# Patient Record
Sex: Female | Born: 1965 | Race: Black or African American | Hispanic: No | Marital: Single | Smoking: Never smoker
Health system: Southern US, Community
[De-identification: ages and names within clinical notes are randomized; demographics above are authoritative.]

## PROBLEM LIST (undated history)

## (undated) DIAGNOSIS — E876 Hypokalemia: Secondary | ICD-10-CM

## (undated) DIAGNOSIS — M545 Low back pain, unspecified: Secondary | ICD-10-CM

## (undated) DIAGNOSIS — R0602 Shortness of breath: Secondary | ICD-10-CM

## (undated) DIAGNOSIS — I10 Essential (primary) hypertension: Secondary | ICD-10-CM

## (undated) DIAGNOSIS — Z789 Other specified health status: Secondary | ICD-10-CM

## (undated) DIAGNOSIS — E785 Hyperlipidemia, unspecified: Secondary | ICD-10-CM

## (undated) DIAGNOSIS — R0789 Other chest pain: Secondary | ICD-10-CM

## (undated) HISTORY — DX: Hyperlipidemia, unspecified: E78.5

## (undated) HISTORY — PX: LAPAROSCOPIC HYSTERECTOMY: SHX1926

## (undated) HISTORY — DX: Low back pain, unspecified: M54.50

## (undated) HISTORY — DX: Other chest pain: R07.89

## (undated) HISTORY — DX: Other specified health status: Z78.9

## (undated) HISTORY — DX: Hypokalemia: E87.6

## (undated) HISTORY — DX: Shortness of breath: R06.02

## (undated) HISTORY — PX: TONSILLECTOMY: SUR1361

---

## 2000-12-04 ENCOUNTER — Encounter: Admission: RE | Admit: 2000-12-04 | Discharge: 2000-12-04 | Payer: Self-pay | Admitting: *Deleted

## 2000-12-04 ENCOUNTER — Encounter: Payer: Self-pay | Admitting: *Deleted

## 2002-06-08 ENCOUNTER — Other Ambulatory Visit: Admission: RE | Admit: 2002-06-08 | Discharge: 2002-06-08 | Payer: Self-pay | Admitting: Gynecology

## 2003-10-21 ENCOUNTER — Emergency Department (HOSPITAL_COMMUNITY): Admission: AD | Admit: 2003-10-21 | Discharge: 2003-10-21 | Payer: Self-pay | Admitting: Family Medicine

## 2003-10-25 ENCOUNTER — Other Ambulatory Visit: Admission: RE | Admit: 2003-10-25 | Discharge: 2003-10-25 | Payer: Self-pay | Admitting: Gynecology

## 2010-08-07 ENCOUNTER — Ambulatory Visit (HOSPITAL_COMMUNITY): Admission: RE | Admit: 2010-08-07 | Discharge: 2010-08-07 | Payer: Self-pay | Admitting: Family Medicine

## 2010-09-05 ENCOUNTER — Inpatient Hospital Stay (HOSPITAL_COMMUNITY)
Admission: EM | Admit: 2010-09-05 | Discharge: 2010-09-07 | Payer: Self-pay | Source: Home / Self Care | Admitting: Emergency Medicine

## 2010-09-06 DIAGNOSIS — D259 Leiomyoma of uterus, unspecified: Secondary | ICD-10-CM

## 2010-09-07 ENCOUNTER — Encounter (INDEPENDENT_AMBULATORY_CARE_PROVIDER_SITE_OTHER): Payer: Self-pay | Admitting: Nurse Practitioner

## 2010-09-08 ENCOUNTER — Ambulatory Visit: Payer: Self-pay | Admitting: Nurse Practitioner

## 2010-09-08 DIAGNOSIS — E876 Hypokalemia: Secondary | ICD-10-CM

## 2010-09-11 ENCOUNTER — Encounter (INDEPENDENT_AMBULATORY_CARE_PROVIDER_SITE_OTHER): Payer: Self-pay | Admitting: Nurse Practitioner

## 2010-11-06 ENCOUNTER — Emergency Department (HOSPITAL_COMMUNITY)
Admission: EM | Admit: 2010-11-06 | Discharge: 2010-11-06 | Payer: Self-pay | Source: Home / Self Care | Admitting: Emergency Medicine

## 2010-12-19 NOTE — Letter (Signed)
Summary: PHYSICIAN'S ORDER  PHYSICIAN'S ORDER   Imported By: Arta Bruce 09/11/2010 16:41:28  _____________________________________________________________________  External Attachment:    Type:   Image     Comment:   External Document

## 2010-12-19 NOTE — Letter (Signed)
Summary: PT INFORMATIOJN SHEET  PT INFORMATIOJN SHEET   Imported By: Arta Bruce 09/11/2010 12:13:54  _____________________________________________________________________  External Attachment:    Type:   Image     Comment:   External Document

## 2010-12-19 NOTE — Miscellaneous (Signed)
Summary: Hospital Update  Clinical Lists Changes  Notes entered as per hospital f/u  Medications: Added new medication of DOCUSATE SODIUM 100 MG CAPS (DOCUSATE SODIUM) One capsule by mouth two times a day Added new medication of FERROUS SULFATE 325 (65 FE) MG TBEC (FERROUS SULFATE) One tablet by mouth three times a day Added new medication of POTASSIUM CHLORIDE CRYS CR 20 MEQ CR-TABS (POTASSIUM CHLORIDE CRYS CR) One tablet by mouth daily Added new medication of FEXOFENADINE HCL 180 MG TABS (FEXOFENADINE HCL) One table by mouth daily for allergies Added new medication of VITAMIN C 100 MG CHEW (ASCORBIC ACID) One tablet by mouth daily Added new medication of VITAMIN E 400 UNIT CAPS (VITAMIN E) One capsule by mouth daily Allergies: Added new allergy or adverse reaction of SULFA Added new allergy or adverse reaction of PENICILLIN Added new allergy or adverse reaction of * NICKEL Observations: Added new observation of NKA: F (09/11/2010 10:20)  Appended Document: Hospital Update    Clinical Lists Changes  Problems: Added new problem of FIBROIDS, UTERUS (ICD-218.9) Observations: Added new observation of PELVIS US: enlarged uterus at least three focal fibroids.  The largest fibroid is in the posterior uterine body and has a submucosal component that displaces the endometrium anteriorly.  Normal ovaries (09/06/2010 10:44) Added new observation of PLATELETK/UL: 796 K/uL (09/06/2010 10:43) Added new observation of RDW: 32.9 % (09/06/2010 10:43) Added new observation of MCHC RBC: 32.5 g/dL (04/54/0981 19:14) Added new observation of MCV: 68.5 fL (09/06/2010 10:43) Added new observation of HCT: 29.2 % (09/06/2010 10:43) Added new observation of HGB: 9.5 g/dL (78/29/5621 30:86) Added new observation of RBC M/UL: 4.26 M/uL (09/06/2010 10:43) Added new observation of WBC COUNT: 8.3 10*3/microliter (09/06/2010 10:43) Added new observation of CALCIUM: 8.4 mg/dL (57/84/6962 95:28) Added new  observation of CREATININE: 0.69 mg/dL (41/32/4401 02:72) Added new observation of BUN: 8 mg/dL (53/66/4403 47:42) Added new observation of BG RANDOM: 85 mg/dL (59/56/3875 64:33) Added new observation of CO2 PLSM/SER: 26 meq/L (09/06/2010 10:43) Added new observation of CL SERUM: 108 meq/L (09/06/2010 10:43) Added new observation of K SERUM: 2.7 meq/L (09/06/2010 10:43) Added new observation of NA: 140 meq/L (09/06/2010 10:43) Added new observation of CXR RESULTS: cardiomegaly. No active disease  (09/05/2010 10:46) Added new observation of OTHER X-RAY: shoulder - no bony abnormality  (09/05/2010 10:45)       Pelvic US  Procedure date:  09/06/2010  Findings:      enlarged uterus at least three focal fibroids.  The largest fibroid is in the posterior uterine body and has a submucosal component that displaces the endometrium anteriorly.  Normal ovaries  X-ray  Procedure date:  09/05/2010  Findings:      shoulder - no bony abnormality  CXR  Procedure date:  09/05/2010  Findings:      cardiomegaly. No active disease   Pelvic US  Procedure date:  09/06/2010  Findings:      enlarged uterus at least three focal fibroids.  The largest fibroid is in the posterior uterine body and has a submucosal component that displaces the endometrium anteriorly.  Normal ovaries  X-ray  Procedure date:  09/05/2010  Findings:      shoulder - no bony abnormality  CXR  Procedure date:  09/05/2010  Findings:      cardiomegaly. No active disease

## 2010-12-19 NOTE — Letter (Signed)
Summary: *HSN Results Follow up  [facility]  [address] [place], [place] [address]   Phone: [phone]  Fax: [phone]      09/11/2010   [facility] [address] [place], [place]  [address]   Dear  [name],                            ____S.Drinkard,FNP   ____D. Gore,FNP       ____B. McPherson,MD   ____V. Rankins,MD    ____E. Mulberry,MD    __X__N. Daphine Deutscher, FNP  ____D. Reche Dixon, MD    ____K. Philipp Deputy, MD    ____Other     This letter is to inform you that your recent test(s):  _______Pap Smear    ___X____Lab Test     _______X-ray    ___X____ is within acceptable limits  _______ requires a medication change  _______ requires a follow-up lab visit  _______ requires a follow-up visit with your [name]   Comments: Labs done during recent office visit are normal.  Remember to keep your appointment with the [name] on __________________________________.       _________________________________________________________ If you have any questions, please contact our office [phone].                    Sincerely,    Lehman Prom FNP [facility]

## 2011-01-29 LAB — POCT I-STAT, CHEM 8
Calcium, Ion: 1.08 mmol/L — ABNORMAL LOW (ref 1.12–1.32)
Chloride: 101 mEq/L (ref 96–112)
Creatinine, Ser: 0.8 mg/dL (ref 0.4–1.2)
Glucose, Bld: 86 mg/dL (ref 70–99)
Potassium: 3 mEq/L — ABNORMAL LOW (ref 3.5–5.1)

## 2011-01-31 LAB — CBC
HCT: 29.2 % — ABNORMAL LOW (ref 36.0–46.0)
HCT: 30.2 % — ABNORMAL LOW (ref 36.0–46.0)
Hemoglobin: 9.7 g/dL — ABNORMAL LOW (ref 12.0–15.0)
MCH: 16.9 pg — ABNORMAL LOW (ref 26.0–34.0)
MCH: 22.3 pg — ABNORMAL LOW (ref 26.0–34.0)
MCHC: 32.2 g/dL (ref 30.0–36.0)
MCV: 55.9 fL — ABNORMAL LOW (ref 78.0–100.0)
MCV: 68.5 fL — ABNORMAL LOW (ref 78.0–100.0)
MCV: 69.2 fL — ABNORMAL LOW (ref 78.0–100.0)
Platelets: 796 10*3/uL — ABNORMAL HIGH (ref 150–400)
Platelets: 955 10*3/uL (ref 150–400)
Platelets: 956 10*3/uL (ref 150–400)
RBC: 2.86 MIL/uL — ABNORMAL LOW (ref 3.87–5.11)
RBC: 4.26 MIL/uL (ref 3.87–5.11)
RDW: 24.4 % — ABNORMAL HIGH (ref 11.5–15.5)
WBC: 4.7 10*3/uL (ref 4.0–10.5)
WBC: 5.5 10*3/uL (ref 4.0–10.5)
WBC: 8.3 10*3/uL (ref 4.0–10.5)

## 2011-01-31 LAB — BASIC METABOLIC PANEL
BUN: 5 mg/dL — ABNORMAL LOW (ref 6–23)
CO2: 24 mEq/L (ref 19–32)
CO2: 31 mEq/L (ref 19–32)
Chloride: 106 mEq/L (ref 96–112)
Chloride: 108 mEq/L (ref 96–112)
Creatinine, Ser: 0.69 mg/dL (ref 0.4–1.2)
GFR calc Af Amer: >60 mL/min (ref >60)
Glucose, Bld: 88 mg/dL (ref 70–99)
Glucose, Bld: 97 mg/dL (ref 70–99)
Potassium: 2.5 mEq/L — CL (ref 3.5–5.1)
Potassium: 2.7 mEq/L — CL (ref 3.5–5.1)
Potassium: 3 mEq/L — ABNORMAL LOW (ref 3.5–5.1)
Sodium: 141 mEq/L (ref 135–145)

## 2011-01-31 LAB — DIFFERENTIAL
Band Neutrophils: 0 % (ref 0–10)
Basophils Relative: 0 % (ref 0–1)
Blasts: 0 %
Eosinophils Absolute: 0 10*3/uL (ref 0.0–0.7)
Eosinophils Absolute: 0.1 10*3/uL (ref 0.0–0.7)
Eosinophils Relative: 0 % (ref 0–5)
Metamyelocytes Relative: 0 %
Monocytes Absolute: 0.1 10*3/uL (ref 0.1–1.0)
Monocytes Absolute: 0.2 10*3/uL (ref 0.1–1.0)
Monocytes Relative: 2 % — ABNORMAL LOW (ref 3–12)
Myelocytes: 0 %
Neutro Abs: 2.8 10*3/uL (ref 1.7–7.7)
Neutrophils Relative %: 52 % (ref 43–77)
nRBC: 0 /100 WBC

## 2011-01-31 LAB — URINALYSIS, ROUTINE W REFLEX MICROSCOPIC
Bilirubin Urine: NEGATIVE
Glucose, UA: NEGATIVE mg/dL
Ketones, ur: NEGATIVE mg/dL
Specific Gravity, Urine: 1.01 (ref 1.005–1.030)
pH: 7.5 (ref 5.0–8.0)

## 2011-01-31 LAB — CROSSMATCH
ABO/RH(D): B POS
Antibody Screen: NEGATIVE
Unit division: 0
Unit division: 0

## 2011-01-31 LAB — RETICULOCYTES
RBC.: 3.04 MIL/uL — ABNORMAL LOW (ref 3.87–5.11)
Retic Ct Pct: 1 % (ref 0.4–3.1)

## 2011-01-31 LAB — URINE CULTURE
Colony Count: 40000
Culture  Setup Time: 201110190109

## 2011-01-31 LAB — IRON AND TIBC: UIBC: >450 ug/dL

## 2011-01-31 LAB — VITAMIN B12: Vitamin B-12: 533 pg/mL (ref 211–911)

## 2011-01-31 LAB — PATHOLOGIST SMEAR REVIEW

## 2011-01-31 LAB — URINE MICROSCOPIC-ADD ON

## 2011-01-31 LAB — POCT PREGNANCY, URINE: Preg Test, Ur: NEGATIVE

## 2011-01-31 LAB — CHLORIDE, URINE, RANDOM: Chloride Urine: 79 mEq/L

## 2011-01-31 LAB — ABO/RH: ABO/RH(D): B POS

## 2011-08-01 ENCOUNTER — Other Ambulatory Visit (HOSPITAL_COMMUNITY): Payer: Self-pay | Admitting: Family Medicine

## 2011-08-31 ENCOUNTER — Other Ambulatory Visit (HOSPITAL_COMMUNITY): Payer: Self-pay | Admitting: Family Medicine

## 2011-08-31 DIAGNOSIS — Z1231 Encounter for screening mammogram for malignant neoplasm of breast: Secondary | ICD-10-CM

## 2011-09-17 ENCOUNTER — Ambulatory Visit (HOSPITAL_COMMUNITY)
Admission: RE | Admit: 2011-09-17 | Discharge: 2011-09-17 | Disposition: A | Discharge status: Home / Self Care | Payer: Self-pay | Source: Ambulatory Visit | Attending: Family Medicine | Admitting: Family Medicine

## 2011-09-17 DIAGNOSIS — Z1231 Encounter for screening mammogram for malignant neoplasm of breast: Secondary | ICD-10-CM

## 2013-01-19 ENCOUNTER — Other Ambulatory Visit (HOSPITAL_COMMUNITY): Payer: Self-pay | Admitting: Family Medicine

## 2013-01-19 DIAGNOSIS — Z1231 Encounter for screening mammogram for malignant neoplasm of breast: Secondary | ICD-10-CM

## 2013-01-28 ENCOUNTER — Ambulatory Visit (HOSPITAL_COMMUNITY)
Admission: RE | Admit: 2013-01-28 | Discharge: 2013-01-28 | Disposition: A | Discharge status: Home / Self Care | Payer: Self-pay | Source: Ambulatory Visit | Attending: Family Medicine | Admitting: Family Medicine

## 2014-04-15 ENCOUNTER — Encounter (HOSPITAL_COMMUNITY): Payer: Self-pay | Admitting: Emergency Medicine

## 2014-04-15 ENCOUNTER — Emergency Department (HOSPITAL_COMMUNITY)
Admission: EM | Admit: 2014-04-15 | Discharge: 2014-04-15 | Disposition: A | Discharge status: Home / Self Care | Payer: No Typology Code available for payment source | Attending: Emergency Medicine | Admitting: Emergency Medicine

## 2014-04-15 DIAGNOSIS — R202 Paresthesia of skin: Secondary | ICD-10-CM

## 2014-04-15 DIAGNOSIS — Z880 Allergy status to penicillin: Secondary | ICD-10-CM | POA: Insufficient documentation

## 2014-04-15 DIAGNOSIS — I10 Essential (primary) hypertension: Secondary | ICD-10-CM

## 2014-04-15 DIAGNOSIS — R209 Unspecified disturbances of skin sensation: Secondary | ICD-10-CM | POA: Insufficient documentation

## 2014-04-15 DIAGNOSIS — R51 Headache: Secondary | ICD-10-CM | POA: Insufficient documentation

## 2014-04-15 DIAGNOSIS — E876 Hypokalemia: Secondary | ICD-10-CM

## 2014-04-15 DIAGNOSIS — Z91040 Latex allergy status: Secondary | ICD-10-CM | POA: Insufficient documentation

## 2014-04-15 HISTORY — DX: Essential (primary) hypertension: I10

## 2014-04-15 LAB — CBC WITH DIFFERENTIAL/PLATELET
Basophils Absolute: 0 10*3/uL (ref 0.0–0.1)
Basophils Relative: 0 % (ref 0–1)
Eosinophils Absolute: 0.1 10*3/uL (ref 0.0–0.7)
Eosinophils Relative: 3 % (ref 0–5)
HCT: 35.4 % — ABNORMAL LOW (ref 36.0–46.0)
HEMOGLOBIN: 12.3 g/dL (ref 12.0–15.0)
LYMPHS ABS: 2.2 10*3/uL (ref 0.7–4.0)
Lymphocytes Relative: 41 % (ref 12–46)
MCH: 28.5 pg (ref 26.0–34.0)
MCHC: 34.7 g/dL (ref 30.0–36.0)
MCV: 82.1 fL (ref 78.0–100.0)
MONOS PCT: 7 % (ref 3–12)
Monocytes Absolute: 0.4 10*3/uL (ref 0.1–1.0)
NEUTROS ABS: 2.7 10*3/uL (ref 1.7–7.7)
NEUTROS PCT: 49 % (ref 43–77)
Platelets: 342 10*3/uL (ref 150–400)
RBC: 4.31 MIL/uL (ref 3.87–5.11)
RDW: 13.4 % (ref 11.5–15.5)
WBC: 5.4 10*3/uL (ref 4.0–10.5)

## 2014-04-15 LAB — COMPREHENSIVE METABOLIC PANEL
ALBUMIN: 3.9 g/dL (ref 3.5–5.2)
ALK PHOS: 91 U/L (ref 39–117)
ALT: 14 U/L (ref 0–35)
AST: 20 U/L (ref 0–37)
BILIRUBIN TOTAL: 0.9 mg/dL (ref 0.3–1.2)
BUN: 11 mg/dL (ref 6–23)
CHLORIDE: 98 meq/L (ref 96–112)
CO2: 33 mEq/L — ABNORMAL HIGH (ref 19–32)
Calcium: 8.7 mg/dL (ref 8.4–10.5)
Creatinine, Ser: 0.75 mg/dL (ref 0.50–1.10)
GFR calc Af Amer: >90 mL/min (ref >90)
GFR calc non Af Amer: >90 mL/min (ref >90)
GLUCOSE: 88 mg/dL (ref 70–99)
POTASSIUM: 2.3 meq/L — AB (ref 3.7–5.3)
Sodium: 143 mEq/L (ref 137–147)
Total Protein: 7.3 g/dL (ref 6.0–8.3)

## 2014-04-15 LAB — I-STAT TROPONIN, ED: TROPONIN I, POC: 0.01 ng/mL (ref 0.00–0.08)

## 2014-04-15 MED ORDER — LISINOPRIL 20 MG PO TABS: 20.0000 mg | ORAL_TABLET | Freq: Every day | ORAL | Status: DC

## 2014-04-15 MED ORDER — POTASSIUM CHLORIDE ER 20 MEQ PO TBCR: 40.0000 meq | EXTENDED_RELEASE_TABLET | Freq: Every day | ORAL | Status: AC

## 2014-04-15 MED ORDER — POTASSIUM CHLORIDE CRYS ER 20 MEQ PO TBCR
40.0000 meq | EXTENDED_RELEASE_TABLET | Freq: Once | ORAL | Status: AC
  Administered 2014-04-15: 40 meq via ORAL
  Filled 2014-04-15: qty 2

## 2014-04-15 MED ORDER — KETOROLAC TROMETHAMINE 15 MG/ML IJ SOLN
15.0000 mg | Freq: Once | INTRAMUSCULAR | Status: AC
  Administered 2014-04-15: 15 mg via INTRAMUSCULAR
  Filled 2014-04-15: qty 1

## 2014-04-15 MED ORDER — KETOROLAC TROMETHAMINE 15 MG/ML IJ SOLN
15.0000 mg | Freq: Once | INTRAMUSCULAR | Status: DC
Start: 2014-04-15 — End: 2014-04-15

## 2014-04-15 NOTE — ED Notes (Signed)
Pt sts that at 1600 she noticed she had "tingling" in her L arm, face and R arm. Pt noted to have full strength in bilateral arms and legs. No sx of stroke. Negative on stroke scale. Clear speech with facial symmetry. Pt denies any CP or SOB.

## 2014-04-15 NOTE — Discharge Instructions (Signed)
Call for a follow up appointment with a Family or Primary Care Provider for further relation of your hypertension and hypo-kalemia (low blood potassium). Return if symptoms worsen.   Stopped taking your current lisinopril/HCTZ medication and only take lisinopril until you're evaluated by her primary care provider.

## 2014-04-15 NOTE — ED Provider Notes (Signed)
CSN: [id number]     Arrival date & time 04/15/14  1646 History   First MD Initiated Contact with Patient 04/15/14 1955     Chief Complaint  Patient presents with  . Tingling     (Consider location/radiation/quality/duration/timing/severity/associated sxs/prior Treatment) HPI Comments: The patient is a [age]-year-old female with past medical history of hypertension presenting the emergency apartment with chief complaint of bilateral upper extremity tingling and facial tingling. The patient reports abrupt onset of symptoms at 1600.  The patient states she went to her house where her tinnitus destroyed her house and left inferior poor condition.  She reports symptoms lasted approximately one hour. She reports associated left sided headache. She reports she was unable to let go of the we'll do to her left hand "would not let go". The patient reports she has a history of hypertension was recently restarted on her lisinopril by her PCP. She reports an increase in stress over the last several days due to a friend's death. Denies shortness of breath, chest pain, fever, coordination problems. Complete hysterectomy and 2014. PCP: [place]  The history is provided by the patient. No language interpreter was used.    Past Medical History  Diagnosis Date  . Hypertension    No past surgical history on file. History reviewed. No pertinent family history. History  Substance Use Topics  . Smoking status: Never Smoker   . Smokeless tobacco: Never Used  . Alcohol Use: No   OB History   Grav Para Term Preterm Abortions TAB SAB Ect Mult Living                 Review of Systems  Constitutional: Negative for fever and chills.  Eyes: Negative for photophobia and visual disturbance.  Respiratory: Negative for shortness of breath.   Cardiovascular: Negative for chest pain.  Gastrointestinal: Negative for nausea, vomiting and abdominal pain.  Genitourinary: Negative for dysuria and vaginal bleeding.   Musculoskeletal: Positive for myalgias.  Neurological: Positive for numbness and headaches. Negative for dizziness, tremors, syncope, facial asymmetry and light-headedness.  Psychiatric/Behavioral: Negative for confusion.      Allergies  Chocolate; Latex; Penicillins; Shellfish allergy; and Sulfonamide derivatives  Home Medications   Prior to Admission medications   Not on File   BP 218/99  Pulse 88  Temp(Src) 98.9 F (37.2 C) (Oral)  Resp 20  SpO2 100% Physical Exam  Nursing note and vitals reviewed. Constitutional: She is oriented to person, place, and time. She appears well-developed and well-nourished.  Non-toxic appearance. She does not have a sickly appearance. She does not appear ill. No distress.  HENT:  Head: Normocephalic and atraumatic.  Right Ear: Tympanic membrane and external ear normal.  Left Ear: Tympanic membrane and external ear normal.  Left temple nontender to palpation  Eyes: EOM are normal. Pupils are equal, round, and reactive to light. No scleral icterus.  Neck: Neck supple.  Cardiovascular: Normal rate, regular rhythm and normal heart sounds.   No murmur heard. Pulmonary/Chest: Effort normal and breath sounds normal. No respiratory distress. She has no wheezes. She has no rales.  Abdominal: Soft. Bowel sounds are normal. There is no tenderness. There is no rebound and no guarding.  Musculoskeletal: Normal range of motion. She exhibits no edema.  Neurological: She is alert and oriented to person, place, and time.  Speech is clear and goal oriented, follows commands Cranial nerves III - XII grossly intact, no facial droop Normal strength in upper and lower extremities bilaterally, strong and equal grip  strength Sensation normal to light touch Moves all 4 extremities without ataxia, coordination intact Normal finger to nose and rapid alternating movements No pronator drift  Skin: Skin is warm and dry. No rash noted. She is not diaphoretic.   Psychiatric: Her speech is normal and behavior is normal. Her mood appears anxious.  Mildly anxious    ED Course  Procedures (including critical care time) Labs Review Results for orders placed during the hospital encounter of 04/15/14  CBC WITH DIFFERENTIAL      Result Value Ref Range   WBC 5.4  4.0 - 10.5 K/uL   RBC 4.31  3.87 - 5.11 MIL/uL   Hemoglobin 12.3  12.0 - 15.0 g/dL   HCT 35.4 (*) 36.0 - 46.0 %   MCV 82.1  78.0 - 100.0 fL   MCH 28.5  26.0 - 34.0 pg   MCHC 34.7  30.0 - 36.0 g/dL   RDW 13.4  11.5 - 15.5 %   Platelets 342  150 - 400 K/uL   Neutrophils Relative % 49  43 - 77 %   Neutro Abs 2.7  1.7 - 7.7 K/uL   Lymphocytes Relative 41  12 - 46 %   Lymphs Abs 2.2  0.7 - 4.0 K/uL   Monocytes Relative 7  3 - 12 %   Monocytes Absolute 0.4  0.1 - 1.0 K/uL   Eosinophils Relative 3  0 - 5 %   Eosinophils Absolute 0.1  0.0 - 0.7 K/uL   Basophils Relative 0  0 - 1 %   Basophils Absolute 0.0  0.0 - 0.1 K/uL  COMPREHENSIVE METABOLIC PANEL      Result Value Ref Range   Sodium 143  137 - 147 mEq/L   Potassium 2.3 (*) 3.7 - 5.3 mEq/L   Chloride 98  96 - 112 mEq/L   CO2 33 (*) 19 - 32 mEq/L   Glucose, Bld 88  70 - 99 mg/dL   BUN 11  6 - 23 mg/dL   Creatinine, Ser 0.75  0.50 - 1.10 mg/dL   Calcium 8.7  8.4 - 10.5 mg/dL   Total Protein 7.3  6.0 - 8.3 g/dL   Albumin 3.9  3.5 - 5.2 g/dL   AST 20  0 - 37 U/L   ALT 14  0 - 35 U/L   Alkaline Phosphatase 91  39 - 117 U/L   Total Bilirubin 0.9  0.3 - 1.2 mg/dL   GFR calc non Af Amer >90  >90 mL/min   GFR calc Af Amer >90  >90 mL/min  I-STAT TROPOININ, ED      Result Value Ref Range   Troponin i, poc 0.01  0.00 - 0.08 ng/mL   Comment 3              EKG Interpretation   Date/Time:  Thursday Apr 15 2014 16:54:22 EDT Ventricular Rate:  77 PR Interval:  131 QRS Duration: 94 QT Interval:  455 QTC Calculation: 515 R Axis:   43 Text Interpretation:  Sinus rhythm Prolonged QT interval Baseline wander  in lead(s) V1 V3 Slow R  progression Confirmed by Jeneen Rinks  MD, [place] ([id number])  on 04/15/2014 8:15:55 PM      MDM   Final diagnoses:  Hypokalemia  Hypertension  Hand tingling   Patient presents with a history of one hour bilateral hand and facial tingling after an emotional upset, likely due to anxiety. Currently asymptomatic in the ED. Her blood pressure on arrival is 218/99, reports  compliance with lisinopril/HCTZ. EKG, CBC, CMP troponin ordered. Potassium 2.3. Previous history of hypokalemia in the past patient relates it to menorrhagia. Reeval patient resting comfortably in room reports resolution of headache after medication. BP 162/80.  Discussed lab results, imaging results, and treatment plan with the patient.  Advised patient to stop HCTZ replace potassium orally. Followup with PCP for repeat potassium level, currently taking Mg supplement.  Return precautions given. Reports understanding and no other concerns at this time.  Patient is stable for discharge at this time.  Meds given in ED:  Medications  ketorolac (TORADOL) 15 MG/ML injection 15 mg (15 mg Intramuscular Given 04/15/14 2027)  potassium chloride SA (K-DUR,KLOR-CON) CR tablet 40 mEq (40 mEq Oral Given 04/15/14 2129)    Discharge Medication List as of 04/15/2014 10:16 PM    START taking these medications   Details  lisinopril (PRINIVIL,ZESTRIL) 20 MG tablet Take 1 tablet (20 mg total) by mouth daily., Starting 04/15/2014, Until Discontinued, Print    potassium chloride 20 MEQ TBCR Take 40 mEq by mouth daily., Starting 04/15/2014, Last dose on Tue 04/20/14, Print           Lorrine Kin, PA-C 04/16/14 [phone]

## 2014-04-15 NOTE — ED Notes (Signed)
DC home with friend.

## 2014-04-19 NOTE — ED Provider Notes (Signed)
Medical screening examination/treatment/procedure(s) were performed by non-physician practitioner and as supervising physician I was immediately available for consultation/collaboration.   EKG Interpretation   Date/Time:  Thursday Apr 15 2014 16:54:22 EDT Ventricular Rate:  77 PR Interval:  131 QRS Duration: 94 QT Interval:  455 QTC Calculation: 515 R Axis:   43 Text Interpretation:  Sinus rhythm Prolonged QT interval Baseline wander  in lead(s) V1 V3 Slow R progression Confirmed by Jeneen Rinks  MD, [place] ([id number])  on 04/15/2014 8:15:55 PM        Tanna Furry, MD 04/19/14 2347

## 2014-09-06 ENCOUNTER — Other Ambulatory Visit (HOSPITAL_COMMUNITY): Payer: Self-pay | Admitting: Specialist

## 2014-09-06 DIAGNOSIS — Z01419 Encounter for gynecological examination (general) (routine) without abnormal findings: Secondary | ICD-10-CM

## 2015-05-13 ENCOUNTER — Emergency Department (HOSPITAL_COMMUNITY)
Admission: EM | Admit: 2015-05-13 | Discharge: 2015-05-13 | Disposition: A | Discharge status: Home / Self Care | Payer: 59 | Attending: Emergency Medicine | Admitting: Emergency Medicine

## 2015-05-13 ENCOUNTER — Encounter (HOSPITAL_COMMUNITY): Payer: Self-pay

## 2015-05-13 ENCOUNTER — Emergency Department (HOSPITAL_COMMUNITY): Payer: 59

## 2015-05-13 DIAGNOSIS — Z79899 Other long term (current) drug therapy: Secondary | ICD-10-CM | POA: Insufficient documentation

## 2015-05-13 DIAGNOSIS — S60351A Superficial foreign body of right thumb, initial encounter: Secondary | ICD-10-CM | POA: Insufficient documentation

## 2015-05-13 DIAGNOSIS — I10 Essential (primary) hypertension: Secondary | ICD-10-CM | POA: Insufficient documentation

## 2015-05-13 DIAGNOSIS — S6991XA Unspecified injury of right wrist, hand and finger(s), initial encounter: Secondary | ICD-10-CM | POA: Diagnosis present

## 2015-05-13 DIAGNOSIS — Y998 Other external cause status: Secondary | ICD-10-CM | POA: Insufficient documentation

## 2015-05-13 DIAGNOSIS — Z91040 Latex allergy status: Secondary | ICD-10-CM | POA: Insufficient documentation

## 2015-05-13 DIAGNOSIS — W458XXA Other foreign body or object entering through skin, initial encounter: Secondary | ICD-10-CM | POA: Diagnosis not present

## 2015-05-13 DIAGNOSIS — Z23 Encounter for immunization: Secondary | ICD-10-CM | POA: Diagnosis not present

## 2015-05-13 DIAGNOSIS — T43211A Poisoning by selective serotonin and norepinephrine reuptake inhibitors, accidental (unintentional), initial encounter: Secondary | ICD-10-CM | POA: Diagnosis not present

## 2015-05-13 DIAGNOSIS — T445X1A Poisoning by predominantly beta-adrenoreceptor agonists, accidental (unintentional), initial encounter: Secondary | ICD-10-CM

## 2015-05-13 DIAGNOSIS — Y929 Unspecified place or not applicable: Secondary | ICD-10-CM | POA: Insufficient documentation

## 2015-05-13 DIAGNOSIS — Y9389 Activity, other specified: Secondary | ICD-10-CM | POA: Diagnosis not present

## 2015-05-13 DIAGNOSIS — Z880 Allergy status to penicillin: Secondary | ICD-10-CM | POA: Insufficient documentation

## 2015-05-13 MED ORDER — MORPHINE SULFATE 4 MG/ML IJ SOLN
4.0000 mg | Freq: Once | INTRAMUSCULAR | Status: AC
  Administered 2015-05-13: 4 mg via INTRAVENOUS
  Filled 2015-05-13: qty 1

## 2015-05-13 MED ORDER — HYDROMORPHONE HCL 1 MG/ML IJ SOLN
1.0000 mg | Freq: Once | INTRAMUSCULAR | Status: AC
  Administered 2015-05-13: 1 mg via INTRAVENOUS
  Filled 2015-05-13: qty 1

## 2015-05-13 MED ORDER — OXYCODONE-ACETAMINOPHEN 5-325 MG PO TABS: 1.0000 | ORAL_TABLET | Freq: Four times a day (QID) | ORAL | Status: AC | PRN

## 2015-05-13 MED ORDER — LIDOCAINE HCL (PF) 1 % IJ SOLN
5.0000 mL | Freq: Once | INTRAMUSCULAR | Status: AC
  Administered 2015-05-13: 5 mL
  Filled 2015-05-13: qty 5

## 2015-05-13 MED ORDER — ONDANSETRON HCL 4 MG/2ML IJ SOLN
4.0000 mg | Freq: Once | INTRAMUSCULAR | Status: AC
  Administered 2015-05-13: 4 mg via INTRAVENOUS
  Filled 2015-05-13: qty 2

## 2015-05-13 MED ORDER — CLINDAMYCIN HCL 150 MG PO CAPS: 300.0000 mg | ORAL_CAPSULE | Freq: Four times a day (QID) | ORAL | Status: DC

## 2015-05-13 MED ORDER — DIPHENHYDRAMINE HCL 50 MG/ML IJ SOLN: 25.0000 mg | Freq: Once | INTRAMUSCULAR | Status: DC

## 2015-05-13 MED ORDER — DIPHENHYDRAMINE HCL 50 MG/ML IJ SOLN
25.0000 mg | Freq: Once | INTRAMUSCULAR | Status: AC
  Administered 2015-05-13: 25 mg via INTRAVENOUS
  Filled 2015-05-13: qty 1

## 2015-05-13 MED ORDER — TETANUS-DIPHTH-ACELL PERTUSSIS 5-2.5-18.5 LF-MCG/0.5 IM SUSP
0.5000 mL | Freq: Once | INTRAMUSCULAR | Status: AC
  Administered 2015-05-13: 0.5 mL via INTRAMUSCULAR
  Filled 2015-05-13: qty 0.5

## 2015-05-13 NOTE — ED Notes (Signed)
Pt has an epipen stuck in her thumb

## 2015-05-13 NOTE — ED Notes (Signed)
Pt states she was helping a little boy out that was having an allergic reaction, states the practice pen had a orange push button, but when she went to do the actual epi pen she pushed the needle side w/ her thumb, epi pen stuck in L thumb.

## 2015-05-13 NOTE — Discharge Instructions (Signed)
You were seen today for foreign body/EpiPen in the right thumb. This was removed. You'll be placed on antibiotics and given follow-up with the hand surgeon.  Your finger appears well-perfused. Injection with epinephrine can be problematic and can cause constriction of the blood vessels. You will be given antibiotics to prevent infection.  Epinephrine injection (Auto-injector) What is this medicine? EPINEPHRINE (ep i NEF rin) is used for the emergency treatment of severe allergic reactions. You should keep this medicine with you at all times. This medicine may be used for other purposes; ask your health care provider or pharmacist if you have questions. COMMON BRAND NAME(S): Adrenaclick, Auvi-Q, EpiPen, Twinject What should I tell my health care provider before I take this medicine? They need to know if you have any of the following conditions: -diabetes -heart disease -high blood pressure -lung or breathing disease, like asthma -Parkinson's disease -thyroid disease -an unusual or allergic reaction to epinephrine, sulfites, other medicines, foods, dyes, or preservatives -pregnant or trying to get pregnant -breast-feeding How should I use this medicine? This medicine is for injection into the outer thigh. Your doctor or health care professional will instruct you on the proper use of the device during an emergency. Read all directions carefully and make sure you understand them. Do not use more often than directed. Talk to your pediatrician regarding the use of this medicine in children. Special care may be needed. This drug is commonly used in children. A special device is available for use in children. Overdosage: If you think you have taken too much of this medicine contact a poison control center or emergency room at once. NOTE: This medicine is only for you. Do not share this medicine with others. What if I miss a dose? This does not apply. You should only use this medicine for an allergic  reaction. What may interact with this medicine? This medicine is only used during an emergency. Significant drug interactions are not likely during emergency use. This list may not describe all possible interactions. Give your health care provider a list of all the medicines, herbs, non-prescription drugs, or dietary supplements you use. Also tell them if you smoke, drink alcohol, or use illegal drugs. Some items may interact with your medicine. What should I watch for while using this medicine? Keep this medicine ready for use in the case of a severe allergic reaction. Make sure that you have the phone number of your doctor or health care professional and local hospital ready. Remember to check the expiration date of your medicine regularly. You may need to have additional units of this medicine with you at work, school, or other places. Talk to your doctor or health care professional about your need for extra units. Some emergencies may require an additional dose. Check with your doctor or a health care professional before using an extra dose. After use, go to the nearest hospital or call 911. Avoid physical activity. Make sure the treating health care professional knows you have received an injection of this medicine. You will receive additional instructions on what to do during and after use of this medicine before a medical emergency occurs. What side effects may I notice from receiving this medicine? Side effects that you should report to your doctor or health care professional as soon as possible: -allergic reactions like skin rash, itching or hives, swelling of the face, lips, or tongue -breathing problems -chest pain -flushing -irregular or pounding heartbeat -numbness in fingers or toes -vomiting Side effects that usually  do not require medical attention (report to your doctor or health care professional if they continue or are bothersome): -anxiety or nervousness -dizzy, drowsy -dry  mouth -headache -increased sweating -nausea -tired, weak This list may not describe all possible side effects. Call your doctor for medical advice about side effects. You may report side effects to FDA at 1-800-FDA-1088. Where should I keep my medicine? Keep out of the reach of children. Store at room temperature between 15 and 30 degrees C (59 and 86 degrees F). Protect from light and heat. The solution should be clear in color. If the solution is discolored or contains particles it must be replaced. Throw away any unused medicine after the expiration date. Ask your doctor or pharmacist about proper disposal of the injector if it is expired or has been used. Always replace your auto-injector before it expires. NOTE: This sheet is a summary. It may not cover all possible information. If you have questions about this medicine, talk to your doctor, pharmacist, or health care provider.  2015, [employer]. (2013-03-16 14:59:01)

## 2015-05-13 NOTE — ED Provider Notes (Signed)
CSN: [id number]     Arrival date & time 05/13/15  0011 History   First MD Initiated Contact with Patient 05/13/15 [phone]     Chief Complaint  Patient presents with  . Extremity Pain     (Consider location/radiation/quality/duration/timing/severity/associated sxs/prior Treatment) HPI  This 49 year old female who presents with an EpiPen stuck in her right thumb. Patient reports that she was trying to assist a young child who was going into anaphylaxis when she accidentally injected the EpiPen in her right thumb. She was unable to remove the EpiPen. She reports 10 out of 10 pain. She denies any pain elsewhere including chest pain. She denies any shortness of breath or palpitations. This happened approximately 30 minutes prior to arrival.  Past Medical History  Diagnosis Date  . Hypertension    History reviewed. No pertinent past surgical history. History reviewed. No pertinent family history. History  Substance Use Topics  . Smoking status: Never Smoker   . Smokeless tobacco: Never Used  . Alcohol Use: No   OB History    No data available     Review of Systems  Respiratory: Negative.  Negative for chest tightness and shortness of breath.   Cardiovascular: Negative.  Negative for chest pain.  Musculoskeletal:       Right thumb pain  Skin: Positive for wound. Negative for color change.  All other systems reviewed and are negative.     Allergies  Penicillins; Shellfish allergy; Chocolate; Sulfonamide derivatives; and Latex  Home Medications   Prior to Admission medications   Medication Sig Start Date End Date Taking? Authorizing Provider  atenolol (TENORMIN) 50 MG tablet Take 50 mg by mouth daily.   Yes Historical Provider, MD  EPINEPHrine (EPIPEN 2-PAK) 0.3 mg/0.3 mL IJ SOAJ injection Inject 0.3 mg into the muscle once.   Yes Historical Provider, MD  fexofenadine (ALLEGRA) 180 MG tablet Take 180 mg by mouth daily as needed for allergies or rhinitis.   Yes Historical  Provider, MD  ibuprofen (ADVIL,MOTRIN) 200 MG tablet Take 600 mg by mouth every 6 (six) hours as needed for moderate pain.   Yes Historical Provider, MD  lisinopril (PRINIVIL,ZESTRIL) 20 MG tablet Take 1 tablet (20 mg total) by mouth daily. Patient taking differently: Take 40 mg by mouth daily.  04/15/14  Yes Harvie Heck, PA-C  potassium chloride 20 MEQ TBCR Take 40 mEq by mouth daily. Patient taking differently: Take 20 mEq by mouth daily.  04/15/14 05/13/15 Yes Lauren Jerline Pain, PA-C  clindamycin (CLEOCIN) 150 MG capsule Take 2 capsules (300 mg total) by mouth 4 (four) times daily. 05/13/15   Merryl Hacker, MD  oxyCODONE-acetaminophen (PERCOCET/ROXICET) 5-325 MG per tablet Take 1 tablet by mouth every 6 (six) hours as needed for severe pain. 05/13/15   Merryl Hacker, MD   BP 179/61 mmHg  Pulse 60  Temp(Src) 97.9 F (36.6 C) (Oral)  Resp 16  Ht 5' 2.75" (1.594 m)  Wt 160 lb (72.576 kg)  BMI 28.56 kg/m2  SpO2 96%  LMP 01/18/2013 Physical Exam  Constitutional: She is oriented to person, place, and time. She appears well-developed and well-nourished. No distress.  HENT:  Head: Normocephalic and atraumatic.  Cardiovascular: Normal rate, regular rhythm and normal heart sounds.   No murmur heard. Pulmonary/Chest: Effort normal and breath sounds normal. No respiratory distress. She has no wheezes.  Musculoskeletal:  EpiPen embedded in right fifth distal phalanx, skin surrounding and distal to injury appears well-perfused, flexion and extension of the interphalangeal joint preserved  Neurological: She is alert and oriented to person, place, and time.  Skin: Skin is warm and dry.  Psychiatric: She has a normal mood and affect.  Nursing note and vitals reviewed.   ED Course  FOREIGN BODY REMOVAL Date/Time: 05/13/2015 2:47 AM Performed by: Merryl Hacker Authorized by: Merryl Hacker Consent: Verbal consent obtained. Risks and benefits: risks, benefits and alternatives were  discussed Consent given by: patient Intake: Right thumb. Anesthesia: digital block Local anesthetic: lidocaine 1% without epinephrine Anesthetic total: 3 ml Patient sedated: no Complexity: simple 1 objects recovered. Objects recovered: EpiPen with needle removed Post-procedure assessment: foreign body removed (EpiPen removed without evidence of residual foreign body, skin appears well-perfused with good distal color and perfusion) Patient tolerance: Patient tolerated the procedure well with no immediate complications   (including critical care time) Labs Review Labs Reviewed - No data to display  Imaging Review Dg Finger Thumb Right  05/13/2015   CLINICAL DATA:  Accidentally pushed the wrong side of an epi pen, with epi pen stuck in the right thumb. Initial encounter.  EXAM: RIGHT THUMB 2+V  COMPARISON:  None.  FINDINGS: The epi pen needle is seen extending through the distal aspect of the thumb. The underlying bone is difficult to fully assess. No definite fracture is seen. Visualized joint spaces are preserved.  IMPRESSION: Metallic epi pen needle seen extending through the distal aspect of the thumb.   Electronically Signed   By: Garald Balding M.D.   On: 05/13/2015 02:11     EKG Interpretation None      MDM   Final diagnoses:  Accidental injection of epinephrine, initial encounter  Foreign body of thumb, right, initial encounter    This is a 49 year old female who presents with an EpiPen stuck in her right hand. Nontoxic. Tetanus updated. Patient given pain and nausea medicine. X-ray shows needle through the distal phalanx.  Digitally blocked and EpiPen was removed with gentle traction. No residual foreign body. Patient's neurovascular exam is reassuring. Wound dressed and splint applied. Patient will be given hand follow-up. She was placed on antibiotics for empirically coverage. Of note, patient was noted to be hypertensive at 233/81 on admission. Repeat blood pressure 179/61.  This is likely result of epinephrine injection. Also has a history of underlying hypertension.  After history, exam, and medical workup I feel the patient has been appropriately medically screened and is safe for discharge home. Pertinent diagnoses were discussed with the patient. Patient was given return precautions.     Merryl Hacker, MD 05/13/15 [phone]

## 2017-11-26 DIAGNOSIS — I10 Essential (primary) hypertension: Secondary | ICD-10-CM | POA: Diagnosis not present

## 2018-01-14 DIAGNOSIS — Z1231 Encounter for screening mammogram for malignant neoplasm of breast: Secondary | ICD-10-CM | POA: Diagnosis not present

## 2018-01-14 DIAGNOSIS — Z01419 Encounter for gynecological examination (general) (routine) without abnormal findings: Secondary | ICD-10-CM | POA: Diagnosis not present

## 2018-01-14 DIAGNOSIS — N952 Postmenopausal atrophic vaginitis: Secondary | ICD-10-CM | POA: Diagnosis not present

## 2018-01-14 DIAGNOSIS — Z6827 Body mass index (BMI) 27.0-27.9, adult: Secondary | ICD-10-CM | POA: Diagnosis not present

## 2018-04-18 DIAGNOSIS — Z13228 Encounter for screening for other metabolic disorders: Secondary | ICD-10-CM | POA: Diagnosis not present

## 2018-04-30 DIAGNOSIS — Z0000 Encounter for general adult medical examination without abnormal findings: Secondary | ICD-10-CM | POA: Diagnosis not present

## 2018-09-09 DIAGNOSIS — A150 Tuberculosis of lung: Secondary | ICD-10-CM | POA: Diagnosis not present

## 2018-09-09 DIAGNOSIS — Z111 Encounter for screening for respiratory tuberculosis: Secondary | ICD-10-CM | POA: Diagnosis not present

## 2018-09-09 DIAGNOSIS — Z029 Encounter for administrative examinations, unspecified: Secondary | ICD-10-CM | POA: Diagnosis not present

## 2018-10-09 DIAGNOSIS — Z1211 Encounter for screening for malignant neoplasm of colon: Secondary | ICD-10-CM | POA: Diagnosis not present

## 2018-10-27 DIAGNOSIS — D125 Benign neoplasm of sigmoid colon: Secondary | ICD-10-CM | POA: Diagnosis not present

## 2018-10-27 DIAGNOSIS — D128 Benign neoplasm of rectum: Secondary | ICD-10-CM | POA: Diagnosis not present

## 2018-10-27 DIAGNOSIS — K635 Polyp of colon: Secondary | ICD-10-CM | POA: Diagnosis not present

## 2018-10-27 DIAGNOSIS — K621 Rectal polyp: Secondary | ICD-10-CM | POA: Diagnosis not present

## 2018-10-27 DIAGNOSIS — Z1211 Encounter for screening for malignant neoplasm of colon: Secondary | ICD-10-CM | POA: Diagnosis not present

## 2018-12-30 DIAGNOSIS — R509 Fever, unspecified: Secondary | ICD-10-CM | POA: Diagnosis not present

## 2018-12-30 DIAGNOSIS — J111 Influenza due to unidentified influenza virus with other respiratory manifestations: Secondary | ICD-10-CM | POA: Diagnosis not present

## 2019-02-03 DIAGNOSIS — Z6827 Body mass index (BMI) 27.0-27.9, adult: Secondary | ICD-10-CM | POA: Diagnosis not present

## 2019-02-03 DIAGNOSIS — Z1231 Encounter for screening mammogram for malignant neoplasm of breast: Secondary | ICD-10-CM | POA: Diagnosis not present

## 2019-02-03 DIAGNOSIS — Z01419 Encounter for gynecological examination (general) (routine) without abnormal findings: Secondary | ICD-10-CM | POA: Diagnosis not present

## 2019-08-20 DIAGNOSIS — R5383 Other fatigue: Secondary | ICD-10-CM | POA: Diagnosis not present

## 2019-08-20 DIAGNOSIS — Z713 Dietary counseling and surveillance: Secondary | ICD-10-CM | POA: Diagnosis not present

## 2019-08-20 DIAGNOSIS — I10 Essential (primary) hypertension: Secondary | ICD-10-CM | POA: Diagnosis not present

## 2019-08-20 DIAGNOSIS — Z23 Encounter for immunization: Secondary | ICD-10-CM | POA: Diagnosis not present

## 2019-08-20 DIAGNOSIS — Z1331 Encounter for screening for depression: Secondary | ICD-10-CM | POA: Diagnosis not present

## 2019-08-20 DIAGNOSIS — M25561 Pain in right knee: Secondary | ICD-10-CM | POA: Diagnosis not present

## 2020-08-29 ENCOUNTER — Ambulatory Visit: Payer: No Typology Code available for payment source | Attending: Internal Medicine

## 2020-08-29 DIAGNOSIS — Z23 Encounter for immunization: Secondary | ICD-10-CM

## 2020-08-29 NOTE — Progress Notes (Signed)
   Covid-19 Vaccination Clinic  Name:  Melinda Carter    MRN: [id number] DOB: 08/09/1966  08/29/2020  Melinda Carter was observed post Covid-19 immunization for 30 minutes based on pre-vaccination screening without incident. She was provided with Vaccine Information Sheet and instruction to access the V-Safe system.   Melinda Carter was instructed to call 911 with any severe reactions post vaccine: Marland Kitchen Difficulty breathing  . Swelling of face and throat  . A fast heartbeat  . A bad rash all over body  . Dizziness and weakness   Immunizations Administered    Name Date Dose VIS Date Route   Pfizer COVID-19 Vaccine 08/29/2020 11:32 AM 0.3 mL 01/13/2019 Intramuscular   Manufacturer: [place]   Lot: [id number]   [place]: [id number]

## 2020-09-19 ENCOUNTER — Ambulatory Visit: Payer: No Typology Code available for payment source | Attending: Internal Medicine

## 2020-09-19 DIAGNOSIS — Z23 Encounter for immunization: Secondary | ICD-10-CM

## 2020-09-19 NOTE — Progress Notes (Signed)
   Covid-19 Vaccination Clinic  Name:  Melinda Carter    MRN: [id number] DOB: 28-Aug-1966  09/19/2020  Ms. Gatchel was observed post Covid-19 immunization for 30 minutes based on pre-vaccination screening without incident. She was provided with Vaccine Information Sheet and instruction to access the V-Safe system.   Ms. Lem was instructed to call 911 with any severe reactions post vaccine: Marland Kitchen Difficulty breathing  . Swelling of face and throat  . A fast heartbeat  . A bad rash all over body  . Dizziness and weakness   Immunizations Administered    Name Date Dose VIS Date Route   Pfizer COVID-19 Vaccine 09/19/2020 10:58 AM 0.3 mL 09/07/2020 Intramuscular   Manufacturer: [place]   Lot: [id number]   [place]: [address]-3

## 2021-05-30 ENCOUNTER — Other Ambulatory Visit: Payer: Self-pay | Admitting: Internal Medicine

## 2021-05-30 DIAGNOSIS — N644 Mastodynia: Secondary | ICD-10-CM

## 2021-06-10 ENCOUNTER — Ambulatory Visit
Admission: RE | Admit: 2021-06-10 | Discharge: 2021-06-10 | Disposition: A | Discharge status: Home / Self Care | Payer: No Typology Code available for payment source | Source: Ambulatory Visit | Attending: Internal Medicine | Admitting: Internal Medicine

## 2021-06-10 ENCOUNTER — Other Ambulatory Visit: Payer: Self-pay

## 2021-06-10 DIAGNOSIS — N644 Mastodynia: Secondary | ICD-10-CM

## 2022-10-22 DIAGNOSIS — I10 Essential (primary) hypertension: Secondary | ICD-10-CM | POA: Diagnosis not present

## 2022-10-29 DIAGNOSIS — Z1339 Encounter for screening examination for other mental health and behavioral disorders: Secondary | ICD-10-CM | POA: Diagnosis not present

## 2022-10-29 DIAGNOSIS — Z1331 Encounter for screening for depression: Secondary | ICD-10-CM | POA: Diagnosis not present

## 2022-10-29 DIAGNOSIS — I10 Essential (primary) hypertension: Secondary | ICD-10-CM | POA: Diagnosis not present

## 2022-10-29 DIAGNOSIS — Z0000 Encounter for general adult medical examination without abnormal findings: Secondary | ICD-10-CM | POA: Diagnosis not present

## 2022-10-29 DIAGNOSIS — R82998 Other abnormal findings in urine: Secondary | ICD-10-CM | POA: Diagnosis not present

## 2022-11-23 IMAGING — MG DIGITAL DIAGNOSTIC BILAT W/ TOMO W/ CAD
8 series · 8 of 24 positions shown · non-contrast
Comparison: Previous exam(s).

CLINICAL DATA: Patient describes generalized LEFT breast and
itching as well as LEFT axillary swelling since last COVID vaccine
in Saturday September, 2020.

EXAM:
DIGITAL DIAGNOSTIC BILATERAL MAMMOGRAM WITH TOMOSYNTHESIS AND CAD;
US AXILLARY LEFT
TECHNIQUE: Bilateral digital diagnostic mammography and breast tomosynthesis
was performed. The images were evaluated with computer-aided
detection.; Targeted ultrasound examination of the left axilla was
performed.

[L CC synth-2D]
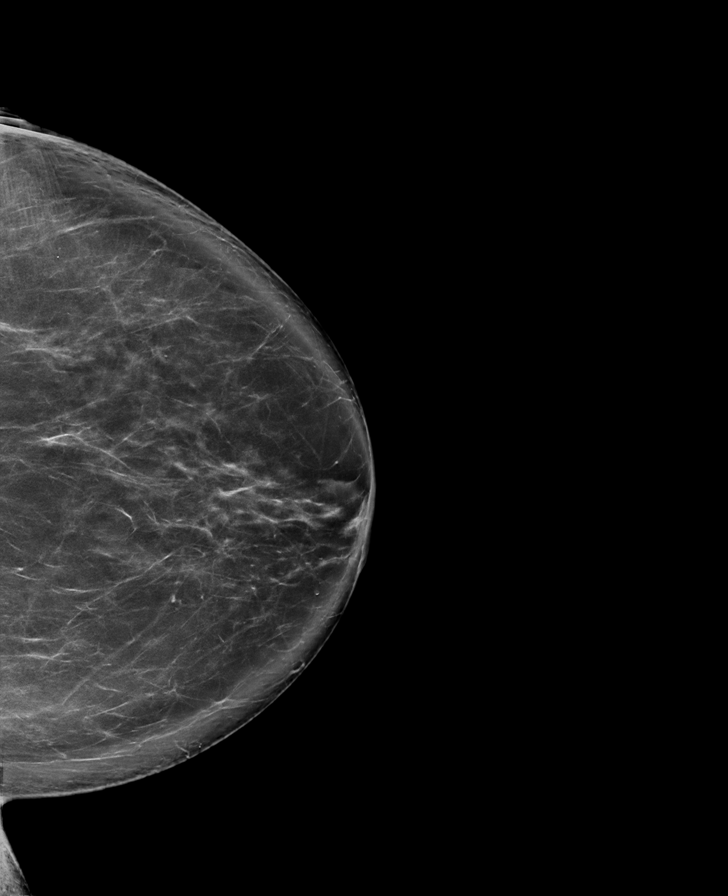

[R CC synth-2D]
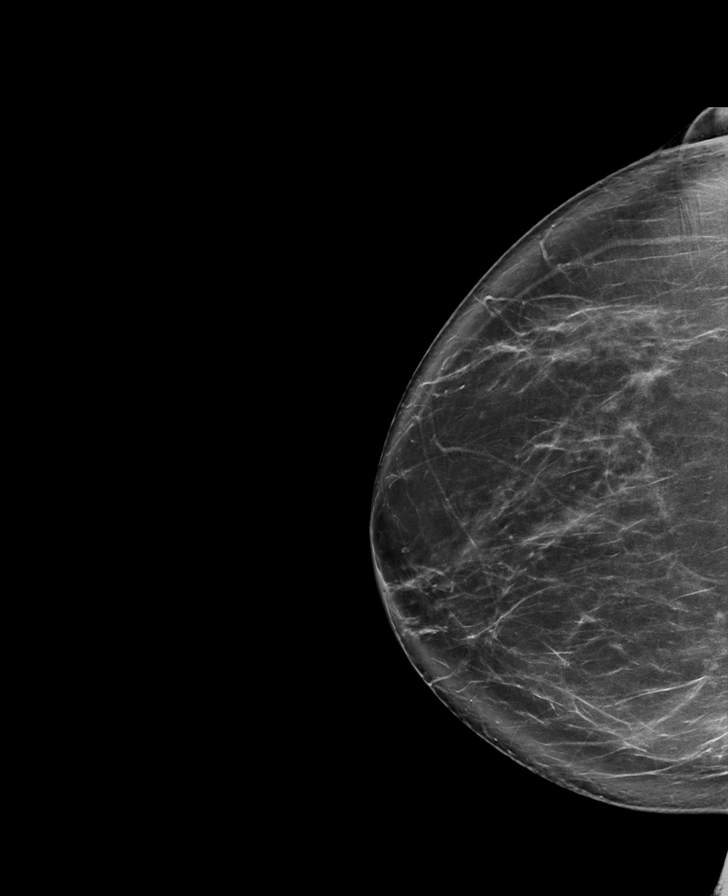

[L MLO synth-2D]
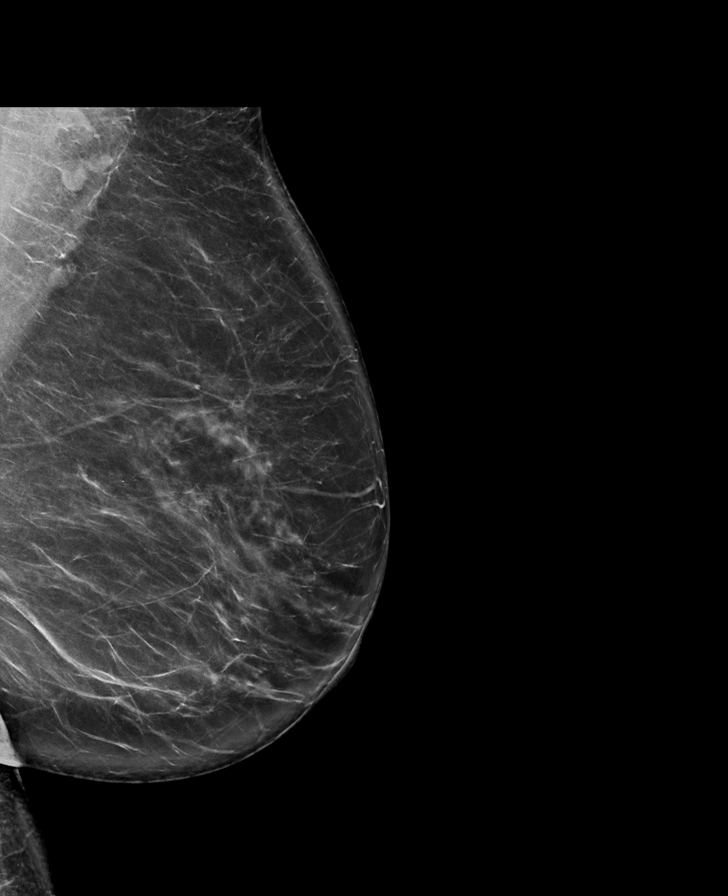

[R MLO synth-2D]
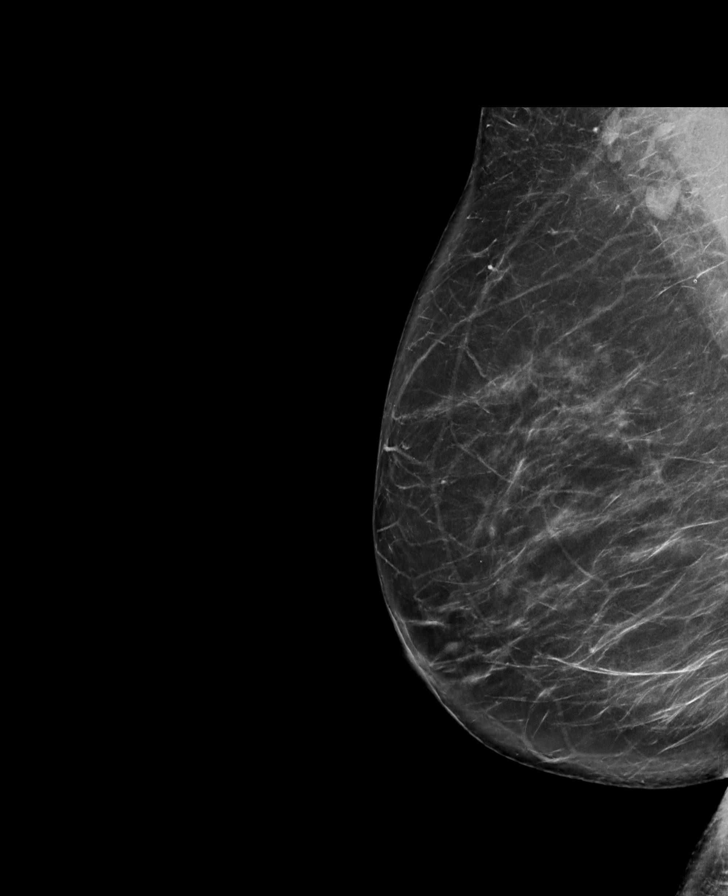

[R MLO tomo · tomo slice 45/90.0]
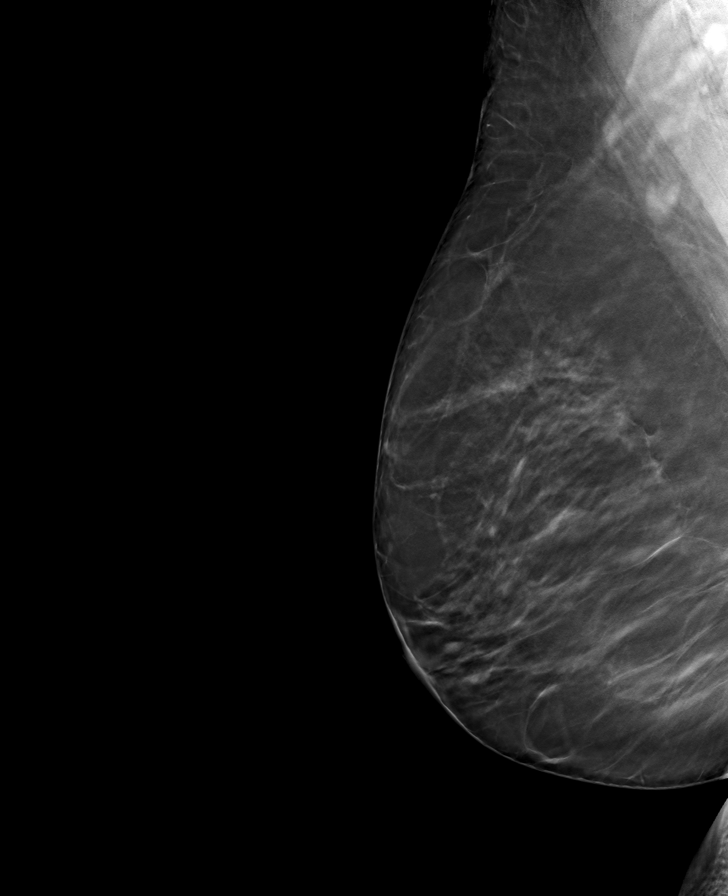

[L CC tomo · tomo slice 50/99.0]
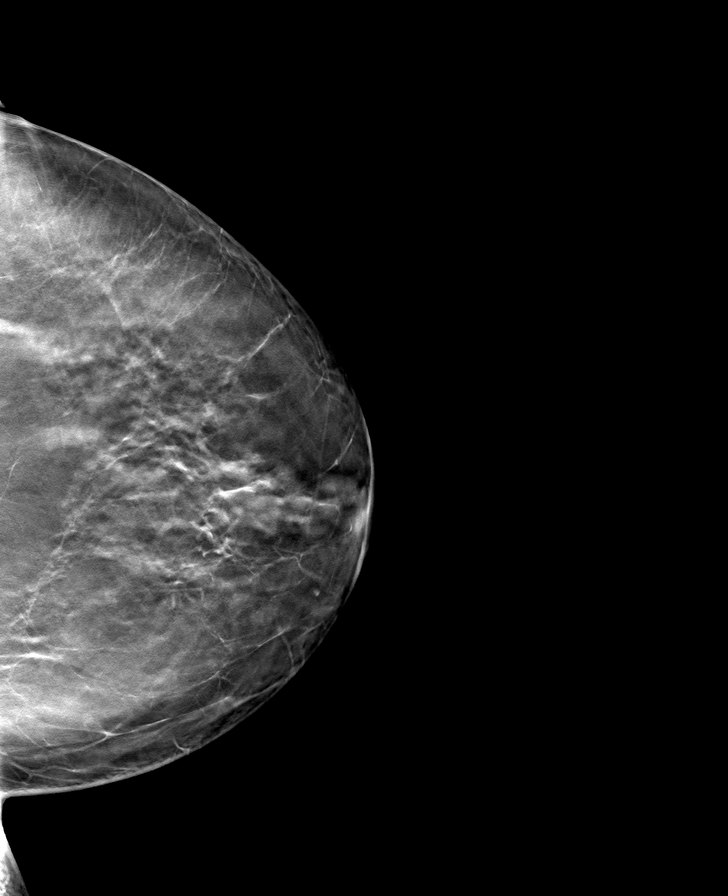

[R CC tomo · tomo slice 47/92.0]
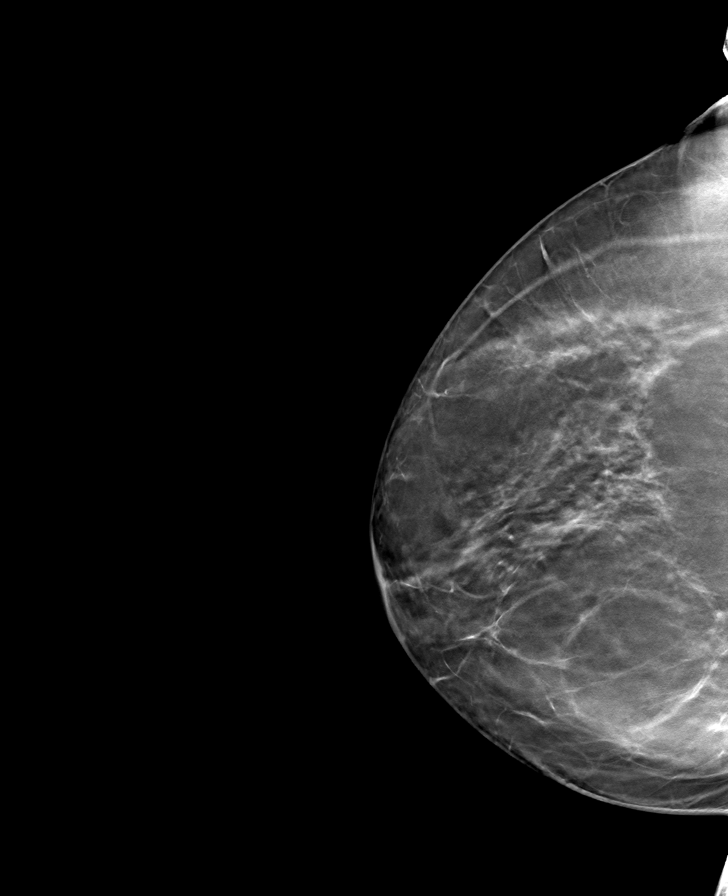

[L MLO tomo · tomo slice 47/94.0]
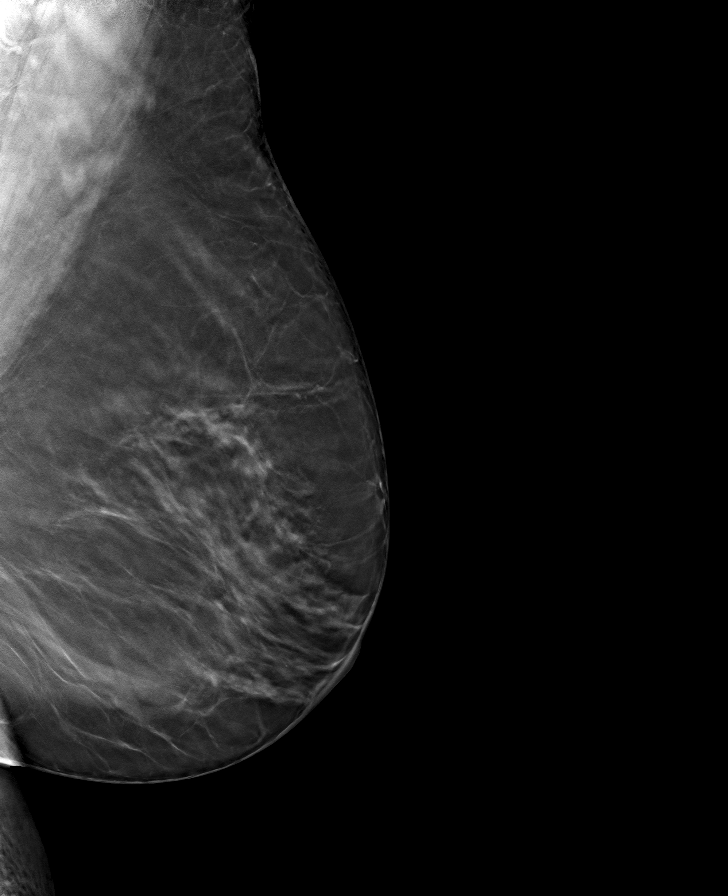

[8 of 24 positions shown; findings below may reference images not displayed]

ACR Breast Density Category b: There are scattered areas of
fibroglandular density.
FINDINGS: Bilateral diagnostic mammogram: There are no new dominant masses,
suspicious calcifications or secondary signs of malignancy within
either breast. Specifically, there is no mammographic abnormality
within the LEFT breast or within the visualized portion of the LEFT
axilla, corresponding to the areas of clinical concern.

Targeted ultrasound is performed, evaluating the LEFT axilla as
directed by the patient, showing only scattered small
normal-appearing lymph nodes. No enlarged lymph nodes. No solid or
cystic mass.
IMPRESSION: 1. No evidence of malignancy within either breast.
2. No evidence of malignancy or lymphadenopathy within the LEFT
axilla.

RECOMMENDATION:
1.  Screening mammogram in one year.(Code:RS-W-OP8)
2. Benign causes of breast pain, and possible remedies, were
discussed with the patient. Patient was encouraged to follow-up with
referring physician if the pain became focal and persistent or if a
palpable lump developed.

I have discussed the findings and recommendations with the patient.
If applicable, a reminder letter will be sent to the patient
regarding the next appointment.

BI-RADS CATEGORY  2: Benign.

## 2022-11-23 IMAGING — US US AXILLARY LEFT
1 series · 6 of 6 positions shown · non-contrast
Comparison: Previous exam(s).

CLINICAL DATA: Patient describes generalized LEFT breast and
itching as well as LEFT axillary swelling since last COVID vaccine
in Saturday September, 2020.

EXAM:
DIGITAL DIAGNOSTIC BILATERAL MAMMOGRAM WITH TOMOSYNTHESIS AND CAD;
US AXILLARY LEFT
TECHNIQUE: Bilateral digital diagnostic mammography and breast tomosynthesis
was performed. The images were evaluated with computer-aided
detection.; Targeted ultrasound examination of the left axilla was
performed.

[Series 1: us axillary left · 0.07mm/px · 6 of 6 slices shown]
[im 1/6]
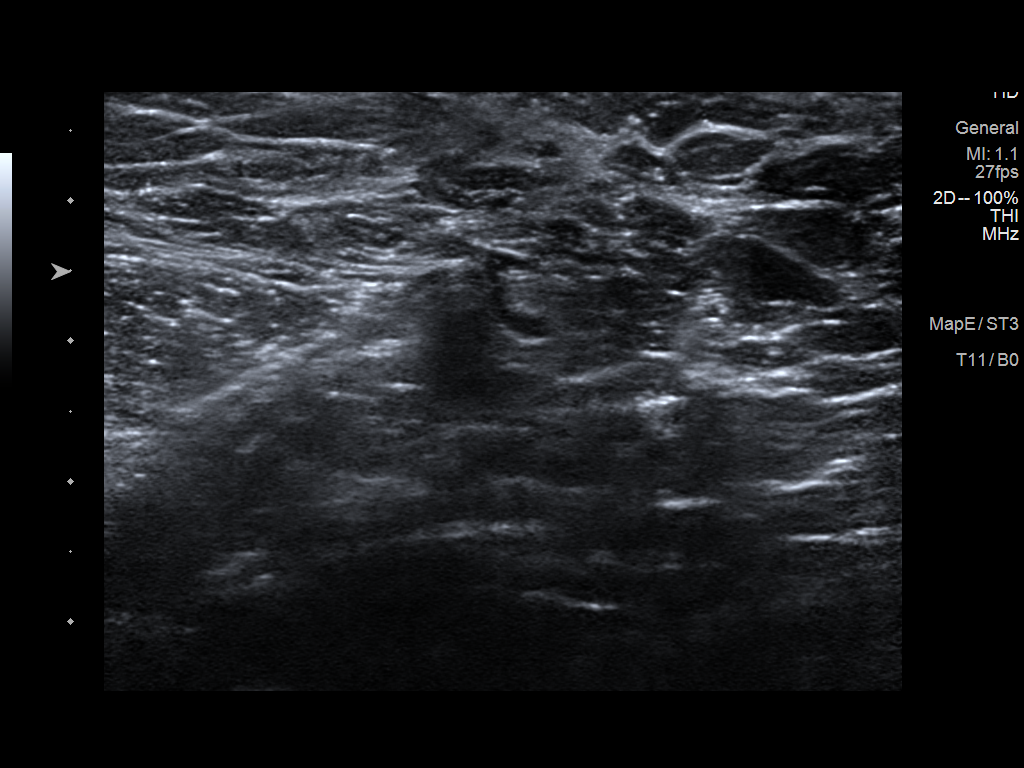
[im 2/6]
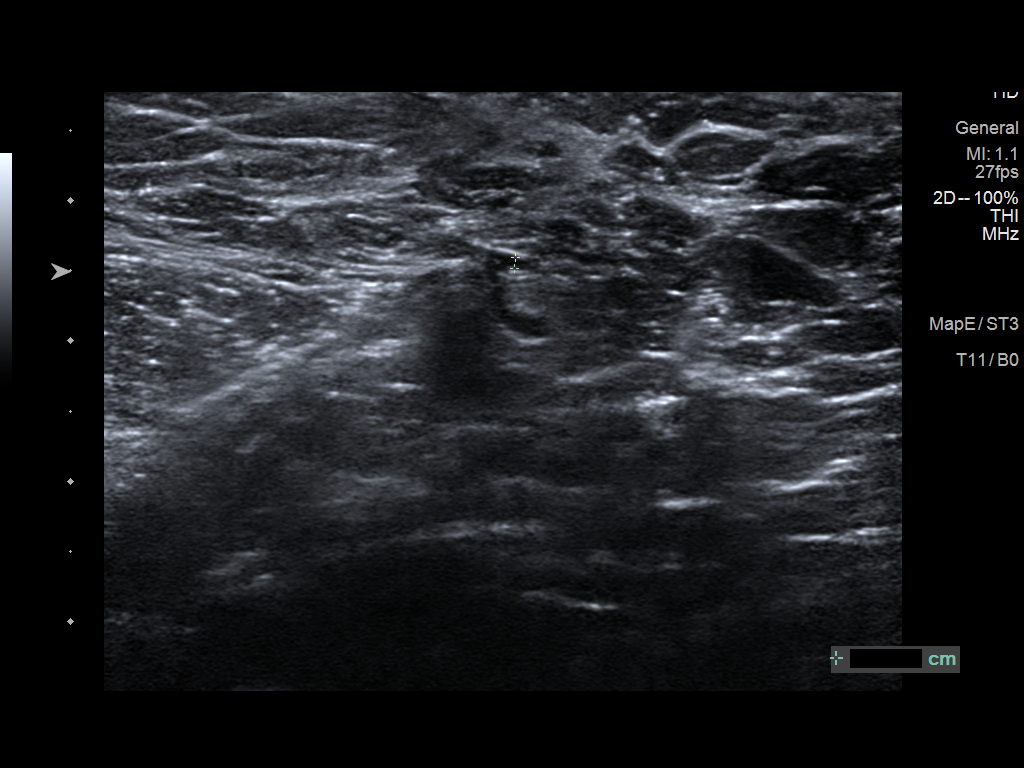
[im 3/6]
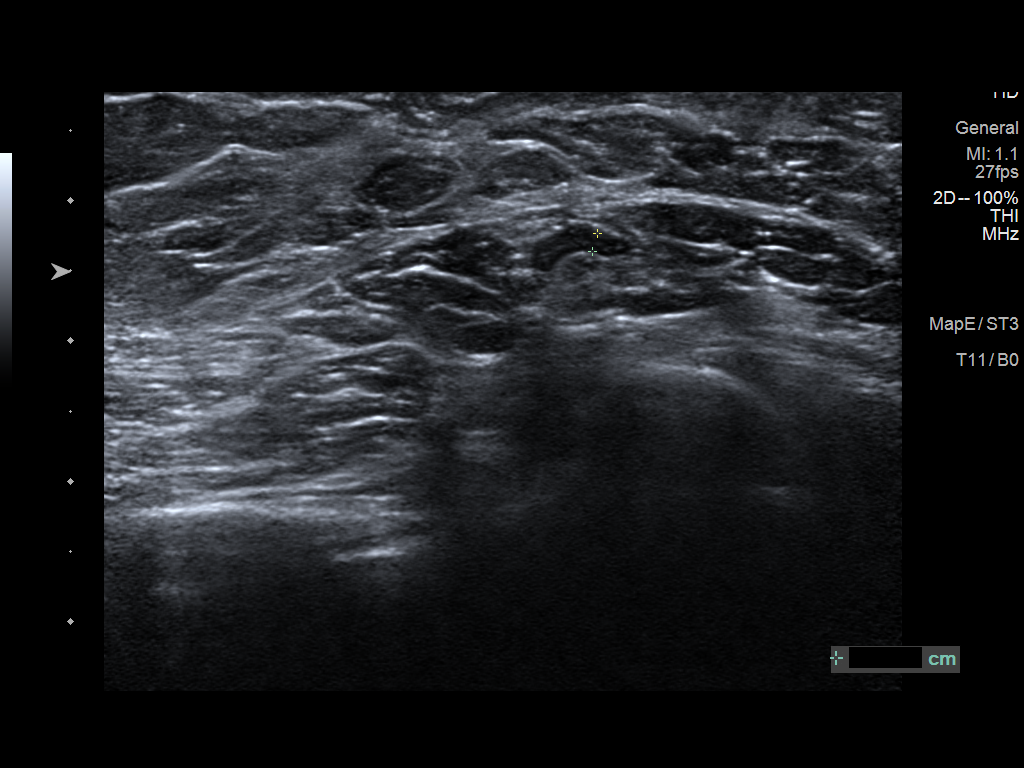
[im 4/6]
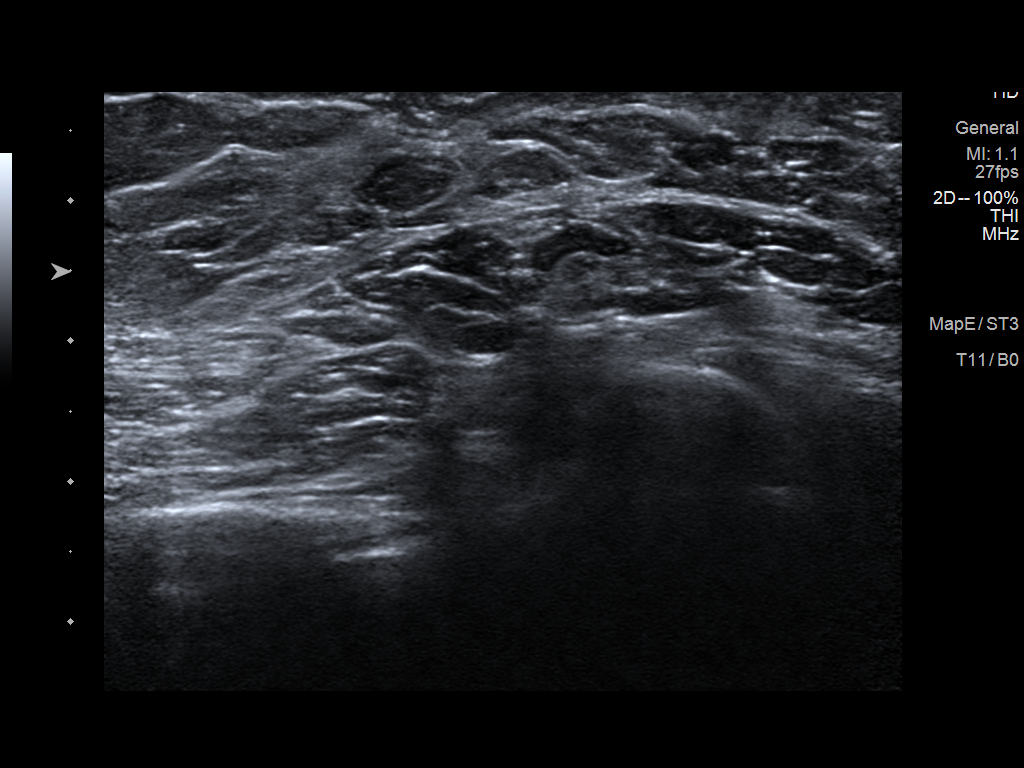
[im 5/6]
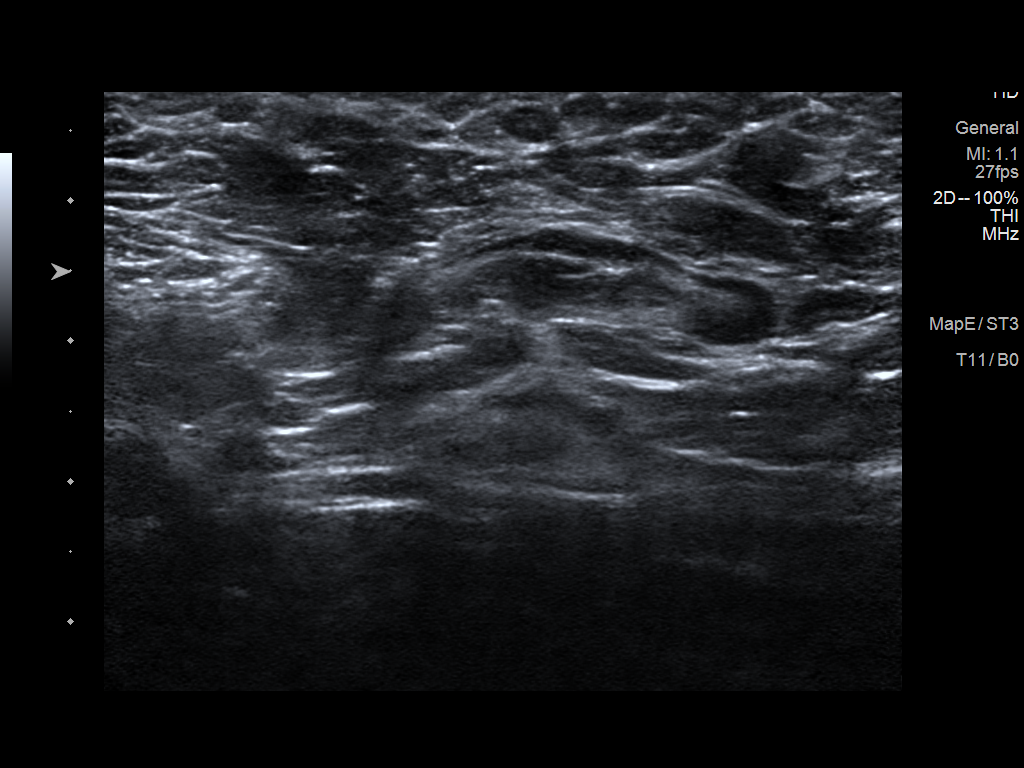
[im 6/6]
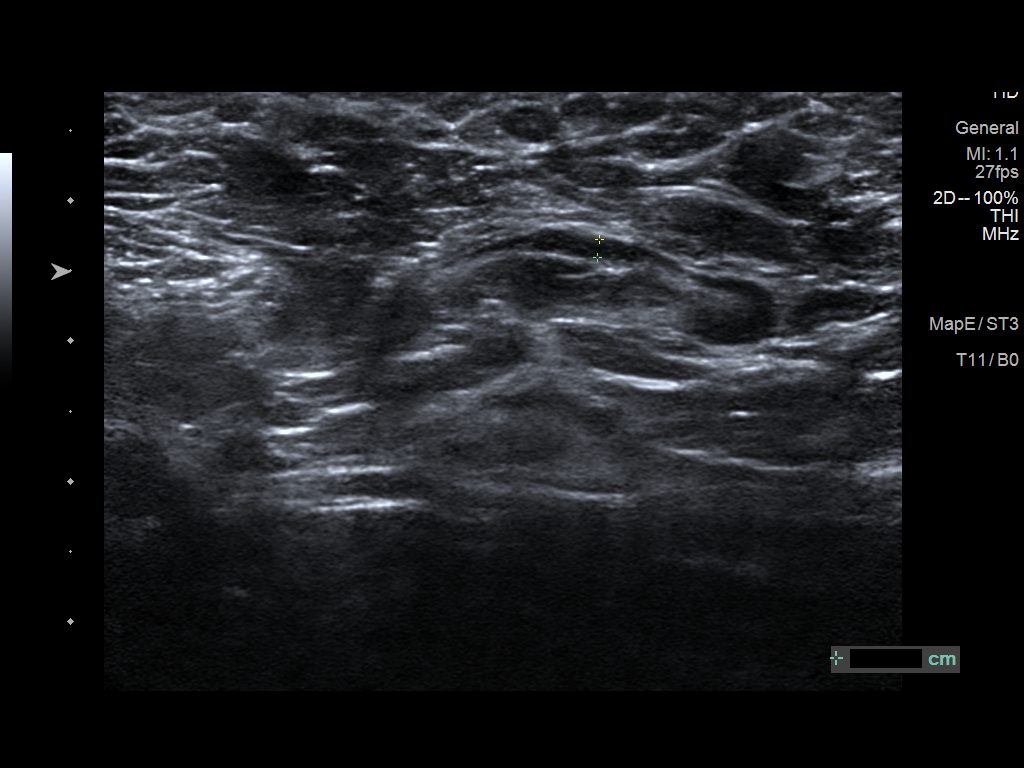

[6 of 6 positions shown; findings below may reference images not displayed]

ACR Breast Density Category b: There are scattered areas of
fibroglandular density.
FINDINGS: Bilateral diagnostic mammogram: There are no new dominant masses,
suspicious calcifications or secondary signs of malignancy within
either breast. Specifically, there is no mammographic abnormality
within the LEFT breast or within the visualized portion of the LEFT
axilla, corresponding to the areas of clinical concern.

Targeted ultrasound is performed, evaluating the LEFT axilla as
directed by the patient, showing only scattered small
normal-appearing lymph nodes. No enlarged lymph nodes. No solid or
cystic mass.
IMPRESSION: 1. No evidence of malignancy within either breast.
2. No evidence of malignancy or lymphadenopathy within the LEFT
axilla.

RECOMMENDATION:
1.  Screening mammogram in one year.(Code:RS-W-OP8)
2. Benign causes of breast pain, and possible remedies, were
discussed with the patient. Patient was encouraged to follow-up with
referring physician if the pain became focal and persistent or if a
palpable lump developed.

I have discussed the findings and recommendations with the patient.
If applicable, a reminder letter will be sent to the patient
regarding the next appointment.

BI-RADS CATEGORY  2: Benign.

## 2023-11-05 DIAGNOSIS — M25512 Pain in left shoulder: Secondary | ICD-10-CM | POA: Diagnosis not present

## 2023-12-17 ENCOUNTER — Other Ambulatory Visit: Payer: Self-pay | Admitting: Internal Medicine

## 2023-12-17 DIAGNOSIS — Z1231 Encounter for screening mammogram for malignant neoplasm of breast: Secondary | ICD-10-CM

## 2024-01-13 ENCOUNTER — Ambulatory Visit
Admission: RE | Admit: 2024-01-13 | Discharge: 2024-01-13 | Disposition: A | Discharge status: Home / Self Care | Payer: Medicaid Other | Source: Ambulatory Visit | Attending: Internal Medicine | Admitting: Internal Medicine

## 2024-01-13 DIAGNOSIS — Z1231 Encounter for screening mammogram for malignant neoplasm of breast: Secondary | ICD-10-CM

## 2024-08-17 ENCOUNTER — Encounter: Payer: Self-pay | Admitting: Cardiology

## 2024-08-17 ENCOUNTER — Ambulatory Visit: Attending: Cardiology | Admitting: Cardiology

## 2024-08-17 VITALS — BP 124/74 | HR 68 | Resp 16 | Ht 62.0 in | Wt 159.0 lb

## 2024-08-17 DIAGNOSIS — E782 Mixed hyperlipidemia: Secondary | ICD-10-CM | POA: Diagnosis present

## 2024-08-17 DIAGNOSIS — I10 Essential (primary) hypertension: Secondary | ICD-10-CM | POA: Insufficient documentation

## 2024-08-17 DIAGNOSIS — R072 Precordial pain: Secondary | ICD-10-CM | POA: Insufficient documentation

## 2024-08-17 NOTE — Patient Instructions (Signed)
 Medication Instructions:  Your physician recommends that you continue on your current medications as directed. Please refer to the Current Medication list given to you today.  *If you need a refill on your cardiac medications before your next appointment, please call your pharmacy*  Lab Work: NONE  If you have labs (blood work) drawn today and your tests are completely normal, you will receive your results only by: MyChart Message (if you have MyChart) OR A paper copy in the mail If you have any lab test that is abnormal or we need to change your treatment, we will call you to review the results.  Testing/Procedures: Your physician has requested that you have an echocardiogram. Echocardiography is a painless test that uses sound waves to create images of your heart. It provides your doctor with information about the size and shape of your heart and how well your heart's chambers and valves are working. This procedure takes approximately one hour. There are no restrictions for this procedure. Please do NOT wear cologne, perfume, aftershave, or lotions (deodorant is allowed). Please arrive 15 minutes prior to your appointment time.  Please note: We ask at that you not bring children with you during ultrasound (echo/ vascular) testing. Due to room size and safety concerns, children are not allowed in the ultrasound rooms during exams. Our front office staff cannot provide observation of children in our lobby area while testing is being conducted. An adult accompanying a patient to their appointment will only be allowed in the ultrasound room at the discretion of the ultrasound technician under special circumstances. We apologize for any inconvenience.   Your physician has ordered you to have non-invasive CT for Calcium Scoring of your heart. It will calculate your risk of developing Coronary Artery Disease (CAD) by measuring the amount of buildup of calcium in the plaque in the coronary arteries  (arteries surrounding your heart).    Your physician has requested that you have an exercise tolerance test. For further information please visit [email or web address] Please also follow instruction sheet, as given.    Stress Test Instructions  1.  Do NOT take N/A  2.  Do NOT eat, drink, or use tobacco products 4 hour prior to the test.  3.  Dress prepared to exercise in comfortable, two piece clothing and walking shoes.  4.  Bring any current prescriptions medications with you the day of the test.  5.  If you need to cancel please do so 24 hours in advance.  6.  If you have any questions please call [phone].   Follow-Up: At [facility], you and your health needs are our priority.  As part of our continuing mission to provide you with exceptional heart care, our providers are all part of one team.  This team includes your primary Cardiologist (physician) and Advanced Practice Providers or APPs (Physician Assistants and Nurse Practitioners) who all work together to provide you with the care you need, when you need it.  Your next appointment:   1 year(s)  Provider:   Madonna Large, DO    We recommend signing up for the patient portal called MyChart.  Sign up information is provided on this After Visit Summary.  MyChart is used to connect with patients for Virtual Visits (Telemedicine).  Patients are able to view lab/test results, encounter notes, upcoming appointments, etc.  Non-urgent messages can be sent to your provider as well.   To learn more about what you can do with MyChart, go to [email or web address].

## 2024-08-17 NOTE — Progress Notes (Signed)
 Cardiology Office Note:    Date:  08/17/2024  NAME:  Melinda Carter    MRN: [id number] DOB:  [birth year]   PCP:  Yolande Toribio MATSU, MD  Former Cardiology Providers: None Primary Cardiologist:  Madonna Large, DO, [facility] (established care 08/17/2024) Electrophysiologist:  None   Referring MD: Celestia Krabbe, NP  Reason of Consult: Chest tightness and shortness of breath  Chief Complaint  Patient presents with   New Patient (Initial Visit)    Chest pain    History of Present Illness:    Melinda Carter is a 57 y.o. African-American female whose past medical history and cardiovascular risk factors includes: Hypertension. She is being seen today for the evaluation of chest pain at the request of Celestia Krabbe, NP.  Chest pain Duration: Intermittent  all my life. Last episode 3 months ago Pinched like sensation. Under the left breast. Intensity 5 out of 10 Improves with walking and relaxing.    She ambulates at least 3-5 times per week for approximately 1 hour.  Mom had heart attack and passed at age of 85.   Current Medications: Current Meds  Medication Sig   amLODipine (NORVASC) 10 MG tablet Take 10 mg by mouth daily.   Ashwagandha 500 MG CAPS Take 1 tablet by mouth daily.   Biotin 10 MG TABS Take 1 tablet by mouth daily.   Calcium Carbonate-Vit D-Min (CALTRATE 600+D PLUS MINERALS) 600-800 MG-UNIT CHEW Chew 1 tablet by mouth daily.   Cholecalciferol 50 MCG (2000 UT) CAPS Take 1 capsule by mouth daily.   fexofenadine (ALLEGRA) 180 MG tablet Take 180 mg by mouth daily as needed for allergies or rhinitis.   olmesartan (BENICAR) 20 MG tablet Take 20 mg by mouth daily.   oxyCODONE -acetaminophen  (PERCOCET/ROXICET) 5-325 MG per tablet Take 1 tablet by mouth every 6 (six) hours as needed for severe pain.   potassium chloride  20 MEQ TBCR Take 40 mEq by mouth daily. (Patient taking differently: Take 20 mEq by mouth daily. )   spironolactone (ALDACTONE) 25 MG tablet Take 25 mg by  mouth daily.   Zinc 50 MG TABS Take 1 tablet by mouth daily.   [DISCONTINUED] atenolol (TENORMIN) 50 MG tablet Take 50 mg by mouth daily.   [DISCONTINUED] Azilsartan Medoxomil (EDARBI) 40 MG TABS Take 1 tablet by mouth daily.   [DISCONTINUED] lisinopril  (PRINIVIL ,ZESTRIL ) 20 MG tablet Take 1 tablet (20 mg total) by mouth daily. (Patient taking differently: Take 40 mg by mouth daily. )     Allergies:    Penicillins, Shellfish allergy, Chocolate, Sulfonamide derivatives, and Latex   Past Medical History: Past Medical History:  Diagnosis Date   HLD (hyperlipidemia)    Hypertension    Hypokalemia    Low back pain    Vegetarian diet     Past Surgical History: Past Surgical History:  Procedure Laterality Date   LAPAROSCOPIC HYSTERECTOMY     TONSILLECTOMY      Social History: Social History   Tobacco Use   Smoking status: Never   Smokeless tobacco: Never  Substance Use Topics   Alcohol use: No   Drug use: No    Family History: Family History  Problem Relation Age of Onset   Diabetes Mother    Hypertension Mother    Huntington's disease Father     ROS:   Review of Systems  Cardiovascular:  Positive for chest pain (see HPI). Negative for claudication, irregular heartbeat, leg swelling, near-syncope, orthopnea, palpitations, paroxysmal nocturnal dyspnea and syncope.  Respiratory:  Negative  for shortness of breath.   Hematologic/Lymphatic: Negative for bleeding problem.    EKGs/Labs/Other Studies Reviewed:   EKG: EKG Interpretation Date/Time:  Monday August 17 2024 08:33:10 EDT Ventricular Rate:  70 PR Interval:  182 QRS Duration:  78 QT Interval:  406 QTC Calculation: 438 R Axis:   5  Text Interpretation: Normal sinus rhythm Cannot rule out Anterior infarct , age undetermined When compared with ECG of 15-Apr-2014 16:54, QT has shortened Confirmed by Michele Richardson [phone]) on 08/17/2024 8:51:08 AM  Echocardiogram: None  Stress Testing:   None   Labs: External Labs: Collected: December 09, 2023 provided by primary care provider. Total cholesterol 217, triglycerides 84, HDL 65, LDL 135, non-HDL 152 Hemoglobin 12.1. Sodium 141, potassium 3.4, chloride 105, bicarb 25. AST, ALT, alkaline phosphatase within normal limits  Physical Exam:    Today's Vitals   08/17/24 0832  BP: 124/74  Pulse: 68  Resp: 16  SpO2: 98%  Weight: 159 lb (72.1 kg)  Height: 5' 2 (1.575 m)   Body mass index is 29.08 kg/m. Wt Readings from Last 3 Encounters:  08/17/24 159 lb (72.1 kg)  05/13/15 160 lb (72.6 kg)    Physical Exam  Constitutional: No distress.  hemodynamically stable  Neck: No JVD present.  Cardiovascular: Normal rate, regular rhythm, S1 normal and S2 normal. Exam reveals no gallop, no S3 and no S4.  No murmur heard. Pulmonary/Chest: Effort normal and breath sounds normal. No stridor. She has no wheezes. She has no rales.  Musculoskeletal:        General: No edema.     Cervical back: Neck supple.  Skin: Skin is warm.     Impression & Recommendation(s):  Impression:   ICD-10-CM   1. Precordial pain  R07.2 ECHOCARDIOGRAM COMPLETE    EXERCISE TOLERANCE TEST (ETT)    CT CARDIAC SCORING (SELF PAY ONLY)    2. Essential hypertension  I10 EKG 12-Lead    3. Mixed hyperlipidemia  E78.2        Recommendation(s):  Precordial pain Based on symptoms predominantly cardiac EKG nonischemic Risk factors: Family history, hypertension, hyperlipidemia Echo will be ordered to evaluate for structural heart disease and left ventricular systolic function. Exercise treadmill stress test Coronary calcium score  Essential hypertension Office blood pressures are very well-controlled Continue amlodipine 10 mg p.o. every morning Continue spironolactone 25 mg p.o. every morning Continue olmesartan 20 mg p.o. every afternoon  Mixed hyperlipidemia Lipids from January 2025 reviewed. Reemphasized the importance of dietary  restrictions of foods high in cholesterol Should continue to have it rechecked in January 2026. Coronary calcium score for further risk stratification   Orders Placed:  Orders Placed This Encounter  Procedures   CT CARDIAC SCORING (SELF PAY ONLY)    Standing Status:   Future    Expiration Date:   08/17/2025    [facility]    Is patient pregnant?:   No   EXERCISE TOLERANCE TEST (ETT)    Standing Status:   Future    Expiration Date:   08/17/2025    Where should this test be performed:   [facility]    Stress with pharmacologic or treadmill ?:   Treadmill w/ exercise   EKG 12-Lead   ECHOCARDIOGRAM COMPLETE    Standing Status:   Future    Expiration Date:   08/17/2025    Where should this test be performed:   [facility]    Does  the patient weigh less than or greater than 250 lbs?:   Patient weighs less than 250 lbs    Perflutren DEFINITY (image enhancing agent) should be administered unless hypersensitivity or allergy exist:   Administer Perflutren    Reason for exam-Echo:   Other-Full Diagnosis List    Full ICD-10/Reason for Exam:   Precordial chest pain [[id number]]     Final Medication List:   No orders of the defined types were placed in this encounter.   Medications Discontinued During This Encounter  Medication Reason   clindamycin  (CLEOCIN ) 150 MG capsule Completed Course   ibuprofen (ADVIL,MOTRIN) 200 MG tablet Patient Preference   lisinopril  (PRINIVIL ,ZESTRIL ) 20 MG tablet Patient Preference   Azilsartan Medoxomil (EDARBI) 40 MG TABS Patient Preference   atenolol (TENORMIN) 50 MG tablet Patient Preference     Current Outpatient Medications:    amLODipine (NORVASC) 10 MG tablet, Take 10 mg by mouth daily., Disp: , Rfl:    Ashwagandha 500 MG CAPS, Take 1 tablet by mouth daily., Disp: , Rfl:    Biotin 10 MG TABS, Take 1 tablet by mouth daily., Disp: , Rfl:    Calcium Carbonate-Vit D-Min (CALTRATE 600+D PLUS  MINERALS) 600-800 MG-UNIT CHEW, Chew 1 tablet by mouth daily., Disp: , Rfl:    Cholecalciferol 50 MCG (2000 UT) CAPS, Take 1 capsule by mouth daily., Disp: , Rfl:    fexofenadine (ALLEGRA) 180 MG tablet, Take 180 mg by mouth daily as needed for allergies or rhinitis., Disp: , Rfl:    olmesartan (BENICAR) 20 MG tablet, Take 20 mg by mouth daily., Disp: , Rfl:    oxyCODONE -acetaminophen  (PERCOCET/ROXICET) 5-325 MG per tablet, Take 1 tablet by mouth every 6 (six) hours as needed for severe pain., Disp: 10 tablet, Rfl: 0   potassium chloride  20 MEQ TBCR, Take 40 mEq by mouth daily. (Patient taking differently: Take 20 mEq by mouth daily. ), Disp: 10 tablet, Rfl: 0   spironolactone (ALDACTONE) 25 MG tablet, Take 25 mg by mouth daily., Disp: , Rfl:    Zinc 50 MG TABS, Take 1 tablet by mouth daily., Disp: , Rfl:    EPINEPHrine (EPIPEN 2-PAK) 0.3 mg/0.3 mL IJ SOAJ injection, Inject 0.3 mg into the muscle once. (Patient not taking: Reported on 08/17/2024), Disp: , Rfl:   Consent:   Informed Consent   Shared Decision Making/Informed Consent The risks [chest pain, shortness of breath, cardiac arrhythmias, dizziness, blood pressure fluctuations, myocardial infarction, stroke/transient ischemic attack, and life-threatening complications (estimated to be 1 in 10,000)], benefits (risk stratification, diagnosing coronary artery disease, treatment guidance) and alternatives of an exercise tolerance test were discussed in detail with Ms. Gadea and she agrees to proceed.     Disposition:   1 year follow-up sooner if needed Patient may be asked to follow-up sooner based on the results of the above-mentioned testing.  Her questions and concerns were addressed to her satisfaction. She voices understanding of the recommendations provided during this encounter.    Signed, Madonna Michele HAS, [facility]  A Division of [facility] [address], [place], [place] [address]   08/17/2024 9:18 AM

## 2024-08-20 ENCOUNTER — Encounter (HOSPITAL_COMMUNITY): Payer: Self-pay | Admitting: *Deleted

## 2024-09-07 ENCOUNTER — Ambulatory Visit (HOSPITAL_COMMUNITY)
Admission: RE | Admit: 2024-09-07 | Discharge: 2024-09-07 | Disposition: A | Discharge status: Home / Self Care | Source: Ambulatory Visit | Attending: Cardiology | Admitting: Cardiology

## 2024-09-07 DIAGNOSIS — R072 Precordial pain: Secondary | ICD-10-CM | POA: Insufficient documentation

## 2024-09-07 LAB — ECHOCARDIOGRAM COMPLETE
AR max vel: 2.02 cm2
AV Area VTI: 2.28 cm2
AV Area mean vel: 2.19 cm2
AV Mean grad: 3 mmHg
AV Peak grad: 6.1 mmHg
Ao pk vel: 1.23 m/s
Area-P 1/2: 3.34 cm2
S' Lateral: 2.67 cm

## 2024-09-10 ENCOUNTER — Ambulatory Visit (HOSPITAL_COMMUNITY)
Admission: RE | Admit: 2024-09-10 | Discharge: 2024-09-10 | Disposition: A | Discharge status: Home / Self Care | Payer: Self-pay | Source: Ambulatory Visit | Attending: Cardiology | Admitting: Cardiology

## 2024-09-10 ENCOUNTER — Ambulatory Visit (HOSPITAL_COMMUNITY)
Admission: RE | Admit: 2024-09-10 | Discharge: 2024-09-10 | Disposition: A | Discharge status: Home / Self Care | Payer: Self-pay | Source: Ambulatory Visit | Attending: Internal Medicine | Admitting: Internal Medicine

## 2024-09-10 DIAGNOSIS — R072 Precordial pain: Secondary | ICD-10-CM | POA: Insufficient documentation

## 2024-09-11 LAB — EXERCISE TOLERANCE TEST
Angina Index: 0
Duke Treadmill Score: 7
Estimated workload: 8.5
Exercise duration (min): 7 min
Exercise duration (sec): 0 s
MPHR: 160 {beats}/min
Peak HR: 151 {beats}/min
Percent HR: 94 %
Rest HR: 84 {beats}/min
ST Depression (mm): 0 mm

## 2024-09-12 ENCOUNTER — Ambulatory Visit: Payer: Self-pay | Admitting: Cardiology

## 2024-09-18 NOTE — Telephone Encounter (Signed)
 Patient is returning call. 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 Clinic ends at 4:00 PM. She would like a call back between 4:00 PM - 5:00 PM if possible.
